# Patient Record
Sex: Male | Born: 1970 | Race: White | Hispanic: No | Marital: Single | Smoking: Current every day smoker
Health system: Southern US, Community
[De-identification: ages and names within clinical notes are randomized; demographics above are authoritative.]

## PROBLEM LIST (undated history)

## (undated) DIAGNOSIS — E119 Type 2 diabetes mellitus without complications: Secondary | ICD-10-CM

## (undated) DIAGNOSIS — E039 Hypothyroidism, unspecified: Secondary | ICD-10-CM

## (undated) DIAGNOSIS — F17200 Nicotine dependence, unspecified, uncomplicated: Secondary | ICD-10-CM

## (undated) DIAGNOSIS — N529 Male erectile dysfunction, unspecified: Secondary | ICD-10-CM

## (undated) DIAGNOSIS — G4700 Insomnia, unspecified: Secondary | ICD-10-CM

## (undated) DIAGNOSIS — N419 Inflammatory disease of prostate, unspecified: Secondary | ICD-10-CM

## (undated) DIAGNOSIS — E785 Hyperlipidemia, unspecified: Secondary | ICD-10-CM

## (undated) DIAGNOSIS — I10 Essential (primary) hypertension: Secondary | ICD-10-CM

## (undated) DIAGNOSIS — F419 Anxiety disorder, unspecified: Secondary | ICD-10-CM

## (undated) HISTORY — PX: WISDOM TOOTH EXTRACTION: SHX21

## (undated) HISTORY — DX: Hypothyroidism, unspecified: E03.9

## (undated) HISTORY — DX: Inflammatory disease of prostate, unspecified: N41.9

## (undated) HISTORY — DX: Type 2 diabetes mellitus without complications: E11.9

## (undated) HISTORY — DX: Male erectile dysfunction, unspecified: N52.9

## (undated) HISTORY — DX: Hyperlipidemia, unspecified: E78.5

## (undated) HISTORY — DX: Insomnia, unspecified: G47.00

## (undated) HISTORY — DX: Nicotine dependence, unspecified, uncomplicated: F17.200

## (undated) HISTORY — DX: Essential (primary) hypertension: I10

## (undated) HISTORY — DX: Anxiety disorder, unspecified: F41.9

---

## 1994-10-04 HISTORY — PX: WISDOM TOOTH EXTRACTION: SHX21

## 2015-11-18 ENCOUNTER — Encounter: Payer: Self-pay | Admitting: Neurology

## 2015-11-18 ENCOUNTER — Ambulatory Visit (INDEPENDENT_AMBULATORY_CARE_PROVIDER_SITE_OTHER): Payer: BLUE CROSS/BLUE SHIELD | Admitting: Neurology

## 2015-11-18 VITALS — BP 120/78 | HR 96 | Resp 20 | Ht 72.0 in | Wt 219.0 lb

## 2015-11-18 DIAGNOSIS — G4730 Sleep apnea, unspecified: Secondary | ICD-10-CM

## 2015-11-18 DIAGNOSIS — F17200 Nicotine dependence, unspecified, uncomplicated: Secondary | ICD-10-CM

## 2015-11-18 DIAGNOSIS — R0683 Snoring: Secondary | ICD-10-CM | POA: Diagnosis not present

## 2015-11-18 NOTE — Progress Notes (Signed)
SLEEP MEDICINE CLINIC   Provider:  Larey Seat, M D  Referring Provider: Shon Baton, MD Primary Care Physician:  Precious Reel, MD  Chief Complaint  Patient presents with  . New Patient (Initial Visit)    snoring, stops breathing, thinks he needs a sleep study, rm 11, alone   Chief complaint according to patient :  HPI:  Thomas Richard is a 45 y.o. male , seen here as a referral from Dr. Virgina Jock for a sleep consultation,  The patient reports that he had been snoring for many years but didn't feel at the time that his sleep quality was interrupted or impaired. He then was witnessed by a girlfriend to snore but also to stop breathing at night, since she works as a Marine scientist she was well aware of the associated risk factors and health risks. Mr. Thomas Richard also has a medical history of the following comorbidities hypertension and diabetes were endorsed by Dr. Hebert Soho. The patient is an active tobacco user but has not been diagnosed with any primary pulmonary diseases. He is smoking tobacco he has kept his weight steady but his diabetes was not always well controlled. On January 26 his HB A1c was 7.6. His BMI is below 30. 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  Sleep habits are as follows:  His regular bedtime is 10.30 Pm after watching TV and goes to sleep usually promptly. His Bedroom is cool, quiet and dark, he prefers to sleep in the fetal position. He is divorced and sleeps with his dog, sometimes his girlfriend .  He sleeps easily through the night. He has 2-3 nocturia breaks, and goes easily back to sleep. He feels usually spontaneously between 7 and 7.2 30 AM and feels good,  No morning headaches , but sometimes a  dry mouth is noted.   He drinks 1-2 cups of coffee in AM (one mug), no SODA , no Iced tea . He smokes about 1 ppd, for the last 12 years. ETOH ; he drinks 2-3 beer, not daily.  Sleep medical history and family sleep history:  Not aware of childhood  sleep disorders, nobody with diagnosed apnea.   Social history: childless, divorced, one Magazine features editor, Music therapist.    Review of Systems: Out of a complete 14 system review, the patient complains of only the following symptoms, and all other reviewed systems are negative. How likely are you to doze in the following situations: 0 = not likely, 1 = slight chance, 2 = moderate chance, 3 = high chance  Sitting and Reading? 0 Watching Television? 1 Sitting inactive in a public place (theater or meeting)? 0 Lying down in the afternoon when circumstances permit? 1 Sitting and talking to someone? 1 Sitting quietly after lunch without alcohol? 0 In a car, while stopped for a few minutes in traffic?0 As a passenger in a car for an hour without a break?0  Total = 3    Fatigue severity score 9  , depression score 2   Social History   Social History  . Marital Status: Single    Spouse Name: N/A  . Number of Children: N/A  . Years of Education: N/A   Occupational History  . Not on file.   Social History Main Topics  . Smoking status: Current Every Day Smoker -- 1.00 packs/day for 10 years    Types: Cigarettes  . Smokeless tobacco: Not on file  . Alcohol Use: 0.0 oz/week    0  Standard drinks or equivalent per week     Comment: 2 beers per dasy  . Drug Use: No  . Sexual Activity: Not on file   Other Topics Concern  . 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    Family History  Problem Relation Age of Onset  . Depression Mother   . Lung disease Father   . Diabetes Father   . Hypertension Father     Past Medical History  Diagnosis Date  . Diabetes mellitus without complication ([facility])   . Hypertension   . Hyperlipidemia   . Smoker   . Hypothyroid   . Prostatitis   . Insomnia   . ED (erectile dysfunction)   . Anxiety     Past Surgical History  Procedure Laterality Date  . Wisdom tooth  extraction      Current Outpatient Prescriptions  Medication Sig Dispense Refill  . albuterol (PROVENTIL HFA;VENTOLIN HFA) 108 (90 Base) MCG/ACT inhaler Inhale into the lungs every 6 (six) hours as needed for wheezing or shortness of breath.    . ALPRAZolam (XANAX) 1 MG tablet Take 1 mg by mouth at bedtime as needed for anxiety.    Marland Kitchen aspirin 81 MG tablet Take 81 mg by mouth daily.    . cyclobenzaprine (FLEXERIL) 10 MG tablet Take 10 mg by mouth 2 (two) times daily as needed for muscle spasms.    Marland Kitchen escitalopram (LEXAPRO) 10 MG tablet Take 10 mg by mouth daily.    . fosinopril (MONOPRIL) 40 MG tablet Take 40 mg by mouth daily. Take 2 tabs daily.    . Insulin Aspart (NOVOLOG Scanlon) Inject into the skin. Insulin pump. Up to 102 units per day.    . levothyroxine (SYNTHROID, LEVOTHROID) 75 MCG tablet Take 75 mcg by mouth daily before breakfast. 1 tab Mon-Sat 2 tabs on Sunday    . Multiple Vitamin (MULTIVITAMIN) tablet Take 1 tablet by mouth daily.    . sildenafil (REVATIO) 20 MG tablet Take 20 mg by mouth 3 (three) times daily.    . simvastatin (ZOCOR) 40 MG tablet Take 40 mg by mouth daily.     No current facility-administered medications for this visit.    Allergies as of 11/18/2015  . (No Known Allergies)    Vitals: BP 120/78 mmHg  Pulse 96  Resp 20  Ht 6' (1.829 m)  Wt 219 lb (99.338 kg)  BMI 29.70 kg/m2 Last Weight:  Wt Readings from Last 1 Encounters:  11/18/15 219 lb (99.338 kg)   [id number] mass index is 29.7 kg/(m^2).     Last Height:   Ht Readings from Last 1 Encounters:  11/18/15 6' (1.829 m)    Physical exam:  General: The patient is awake, alert and appears not in acute distress. The patient is well groomed. Head: Normocephalic, atraumatic. Neck is supple. Mallampati 3 ,  neck circumference: 17. 5. Nasal airflow unrestricted , TMJ is evident . Retrognathia is mild.  Cardiovascular:  Regular rate and rhythm , without  murmurs or carotid bruit, and without distended neck  veins. Respiratory: Lungs are clear to auscultation. Skin: facial  erythema, palmar erythema.  Trunk: Neurologic exam : The patient is awake and alert, oriented to place and time.   Attention span & concentration ability appears normal.  Speech is fluent,  without  dysarthria, dysphonia or aphasia.  Mood and affect are appropriate.  Cranial  nerves: Pupils are equal and briskly reactive to light. Funduscopic exam without  evidence of pallor or edema.  Extraocular movements  in vertical and horizontal planes intact and without nystagmus. Visual fields by finger perimetry are intact. Hearing to finger rub intact.   Facial sensation intact to fine touch.  Facial motor strength is symmetric and tongue and uvula move midline. Shoulder shrug was symmetrical.   Motor exam:  Normal tone, muscle bulk and symmetric strength in all extremities.  Coordination: Rapid alternating movements in the fingers/hands was normal.  Gait and station: Patient walks without assistive device and is able unassisted to climb up to the exam table. Strength within normal limits. Stance is stable and normal. Turns with 3 Steps.   Deep tendon reflexes: in the upper and lower extremities are symmetric and intact. Babinski maneuver response is downgoing.  The patient was advised of the nature of the diagnosed sleep disorder , the treatment options and risks for general a health and wellness arising from not treating the condition.  I spent more than 30 minutes of face to face time with the patient. Greater than 50% of time was spent in counseling and coordination of care. We have discussed the diagnosis and differential and I answered the patient's questions.     Assessment:  After physical and neurologic examination, review of laboratory studies,  Personal review of imaging studies, reports of other /same  Imaging studies ,  Results of polysomnography/ neurophysiology testing and pre-existing records as far as provided in  visit., my assessment is   1)  Snoring and apneas were observed by the patient's girl friend. He has not noted hypersomnia abut is fatigued sometimes. Morning headaches were denied, but a dry mouth is present.  2)  smoking- He needs to stop, needs help to stop . Chantix offered, he stated Dr Virgina Jock has offered this , too.   3) Capnography needed for Smoker.     Plan:  Treatment plan and additional workup :     Larey Seat MD  11/18/2015   CC: Shon Baton, [place], [place] [address]

## 2015-11-18 NOTE — Patient Instructions (Signed)
Smoking Cessation, Tips for Success If you are ready to quit smoking, congratulations! You have chosen to help yourself be healthier. Cigarettes bring nicotine, tar, carbon monoxide, and other irritants into your body. Your lungs, heart, and blood vessels will be able to work better without these poisons. There are many different ways to quit smoking. Nicotine gum, nicotine patches, a nicotine inhaler, or nicotine nasal spray can help with physical craving. Hypnosis, support groups, and medicines help break the habit of smoking. WHAT THINGS CAN I DO TO MAKE QUITTING EASIER?  Here are some tips to help you quit for good:  Pick a date when you will quit smoking completely. Tell all of your friends and family about your plan to quit on that date.  Do not try to slowly cut down on the number of cigarettes you are smoking. Pick a quit date and quit smoking completely starting on that day.  Throw away all cigarettes.   Clean and remove all ashtrays from your home, work, and car.  On a card, write down your reasons for quitting. Carry the card with you and read it when you get the urge to smoke.  Cleanse your body of nicotine. Drink enough water and fluids to keep your urine clear or pale yellow. Do this after quitting to flush the nicotine from your body.  Learn to predict your moods. Do not let a bad situation be your excuse to have a cigarette. Some situations in your life might tempt you into wanting a cigarette.  Never have "just one" cigarette. It leads to wanting another and another. Remind yourself of your decision to quit.  Change habits associated with smoking. If you smoked while driving or when feeling stressed, try other activities to replace smoking. Stand up when drinking your coffee. Brush your teeth after eating. Sit in a different chair when you read the paper. Avoid alcohol while trying to quit, and try to drink fewer caffeinated beverages. Alcohol and caffeine may urge you to  smoke.  Avoid foods and drinks that can trigger a desire to smoke, such as sugary or spicy foods and alcohol.  Ask people who smoke not to smoke around you.  Have something planned to do right after eating or having a cup of coffee. For example, plan to take a walk or exercise.  Try a relaxation exercise to calm you down and decrease your stress. Remember, you may be tense and nervous for the first 2 weeks after you quit, but this will pass.  Find new activities to keep your hands busy. Play with a pen, coin, or rubber band. Doodle or draw things on paper.  Brush your teeth right after eating. This will help cut down on the craving for the taste of tobacco after meals. You can also try mouthwash.   Use oral substitutes in place of cigarettes. Try using lemon drops, carrots, cinnamon sticks, or chewing gum. Keep them handy so they are available when you have the urge to smoke.  When you have the urge to smoke, try deep breathing.  Designate your home as a nonsmoking area.  If you are a heavy smoker, ask your health care provider about a prescription for nicotine chewing gum. It can ease your withdrawal from nicotine.  Reward yourself. Set aside the cigarette money you save and buy yourself something nice.  Look for support from others. Join a support group or smoking cessation program. Ask someone at home or at work to help you with your plan   to quit smoking.  Always ask yourself, "Do I need this cigarette or is this just a reflex?" Tell yourself, "Today, I choose not to smoke," or "I do not want to smoke." You are reminding yourself of your decision to quit.  Do not replace cigarette smoking with electronic cigarettes (commonly called e-cigarettes). The safety of e-cigarettes is unknown, and some may contain harmful chemicals.  If you relapse, do not give up! Plan ahead and think about what you will do the next time you get the urge to smoke. HOW WILL I FEEL WHEN I QUIT SMOKING? You  may have symptoms of withdrawal because your body is used to nicotine (the addictive substance in cigarettes). You may crave cigarettes, be irritable, feel very hungry, cough often, get headaches, or have difficulty concentrating. The withdrawal symptoms are only temporary. They are strongest when you first quit but will go away within 10-14 days. When withdrawal symptoms occur, stay in control. Think about your reasons for quitting. Remind yourself that these are signs that your body is healing and getting used to being without cigarettes. Remember that withdrawal symptoms are easier to treat than the major diseases that smoking can cause.  Even after the withdrawal is over, expect periodic urges to smoke. However, these cravings are generally short lived and will go away whether you smoke or not. Do not smoke! WHAT RESOURCES ARE AVAILABLE TO HELP ME QUIT SMOKING? Your health care provider can direct you to community resources or hospitals for support, which may include:  Group support.  Education.  Hypnosis.  Therapy.   This information is not intended to replace advice given to you by your health care provider. Make sure you discuss any questions you have with your health care provider.   Document Released: 06/18/2004 Document Revised: 10/11/2014 Document Reviewed: 03/08/2013 Elsevier Interactive Patient Education 2016 Elsevier Inc.  

## 2017-06-27 DIAGNOSIS — E109 Type 1 diabetes mellitus without complications: Secondary | ICD-10-CM | POA: Diagnosis not present

## 2017-07-11 DIAGNOSIS — F17210 Nicotine dependence, cigarettes, uncomplicated: Secondary | ICD-10-CM | POA: Diagnosis not present

## 2017-07-11 DIAGNOSIS — Z23 Encounter for immunization: Secondary | ICD-10-CM | POA: Diagnosis not present

## 2017-07-11 DIAGNOSIS — E038 Other specified hypothyroidism: Secondary | ICD-10-CM | POA: Diagnosis not present

## 2017-07-11 DIAGNOSIS — E109 Type 1 diabetes mellitus without complications: Secondary | ICD-10-CM | POA: Diagnosis not present

## 2017-07-11 DIAGNOSIS — I10 Essential (primary) hypertension: Secondary | ICD-10-CM | POA: Diagnosis not present

## 2017-10-12 DIAGNOSIS — H5213 Myopia, bilateral: Secondary | ICD-10-CM | POA: Diagnosis not present

## 2017-10-12 DIAGNOSIS — E109 Type 1 diabetes mellitus without complications: Secondary | ICD-10-CM | POA: Diagnosis not present

## 2017-10-12 DIAGNOSIS — H524 Presbyopia: Secondary | ICD-10-CM | POA: Diagnosis not present

## 2017-10-12 DIAGNOSIS — H52203 Unspecified astigmatism, bilateral: Secondary | ICD-10-CM | POA: Diagnosis not present

## 2017-11-11 DIAGNOSIS — E038 Other specified hypothyroidism: Secondary | ICD-10-CM | POA: Diagnosis not present

## 2017-11-11 DIAGNOSIS — Z0000 Encounter for general adult medical examination without abnormal findings: Secondary | ICD-10-CM | POA: Diagnosis not present

## 2017-11-11 DIAGNOSIS — E109 Type 1 diabetes mellitus without complications: Secondary | ICD-10-CM | POA: Diagnosis not present

## 2017-11-11 DIAGNOSIS — Z125 Encounter for screening for malignant neoplasm of prostate: Secondary | ICD-10-CM | POA: Diagnosis not present

## 2017-11-11 DIAGNOSIS — R82998 Other abnormal findings in urine: Secondary | ICD-10-CM | POA: Diagnosis not present

## 2017-11-18 DIAGNOSIS — Z0000 Encounter for general adult medical examination without abnormal findings: Secondary | ICD-10-CM | POA: Diagnosis not present

## 2017-11-18 DIAGNOSIS — I10 Essential (primary) hypertension: Secondary | ICD-10-CM | POA: Diagnosis not present

## 2017-11-18 DIAGNOSIS — E038 Other specified hypothyroidism: Secondary | ICD-10-CM | POA: Diagnosis not present

## 2017-11-18 DIAGNOSIS — E109 Type 1 diabetes mellitus without complications: Secondary | ICD-10-CM | POA: Diagnosis not present

## 2017-11-18 DIAGNOSIS — Z1389 Encounter for screening for other disorder: Secondary | ICD-10-CM | POA: Diagnosis not present

## 2017-11-18 DIAGNOSIS — E7849 Other hyperlipidemia: Secondary | ICD-10-CM | POA: Diagnosis not present

## 2017-11-24 ENCOUNTER — Other Ambulatory Visit: Payer: Self-pay | Admitting: Internal Medicine

## 2017-11-24 DIAGNOSIS — E785 Hyperlipidemia, unspecified: Secondary | ICD-10-CM

## 2017-12-02 DIAGNOSIS — E109 Type 1 diabetes mellitus without complications: Secondary | ICD-10-CM | POA: Diagnosis not present

## 2017-12-02 DIAGNOSIS — F17210 Nicotine dependence, cigarettes, uncomplicated: Secondary | ICD-10-CM | POA: Diagnosis not present

## 2017-12-02 DIAGNOSIS — I10 Essential (primary) hypertension: Secondary | ICD-10-CM | POA: Diagnosis not present

## 2017-12-02 DIAGNOSIS — Z6828 Body mass index (BMI) 28.0-28.9, adult: Secondary | ICD-10-CM | POA: Diagnosis not present

## 2017-12-04 DIAGNOSIS — E109 Type 1 diabetes mellitus without complications: Secondary | ICD-10-CM | POA: Diagnosis not present

## 2017-12-12 ENCOUNTER — Other Ambulatory Visit: Payer: Self-pay

## 2017-12-13 ENCOUNTER — Ambulatory Visit
Admission: RE | Admit: 2017-12-13 | Discharge: 2017-12-13 | Disposition: A | Discharge status: Home / Self Care | Payer: No Typology Code available for payment source | Source: Ambulatory Visit | Attending: Internal Medicine | Admitting: Internal Medicine

## 2017-12-13 DIAGNOSIS — E785 Hyperlipidemia, unspecified: Secondary | ICD-10-CM

## 2017-12-13 IMAGING — CT CT HEART SCORING
3 series · 12 of 20 positions shown, 14 images · non-contrast
Comparison: None.

CLINICAL DATA: 46-year-old male with history of elevated
cholesterol. Current smoker.

EXAM:
CT HEART FOR CALCIUM SCORING
TECHNIQUE: CT heart was performed on a 64 channel system using prospective ECG
gating.
A non-contrast exam for calcium scoring was performed.
Note that this exam targets the heart and the chest was not imaged
in its entirety.

[Series 2: calcium scoring 2.00 qr36 bestdiast 70% · axial · 0.39mm/px · z∈[+1516,+1546]mm · 2 of 78 slices shown]
[im 16/78  vessel]
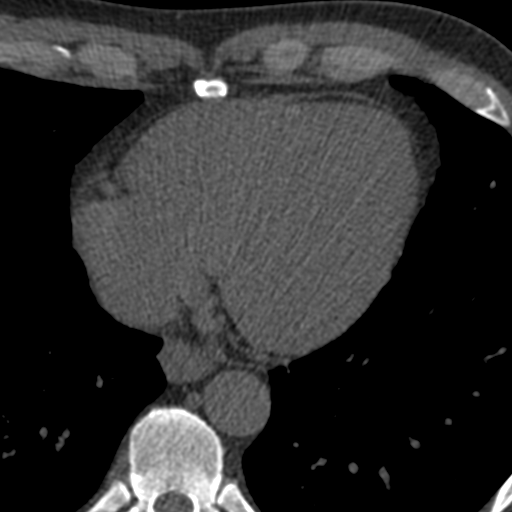
[im 31/78  vessel]
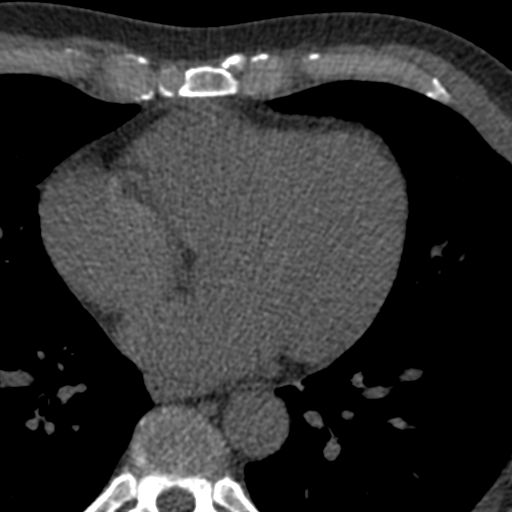

[Series 3: calcium scoring 2.00 br40 bestdiast 70% ax fov · axial · 0.51mm/px · z∈[+1510,+1614]mm · 5 of 78 slices shown, 7 images]
[im 13/78  vessel]
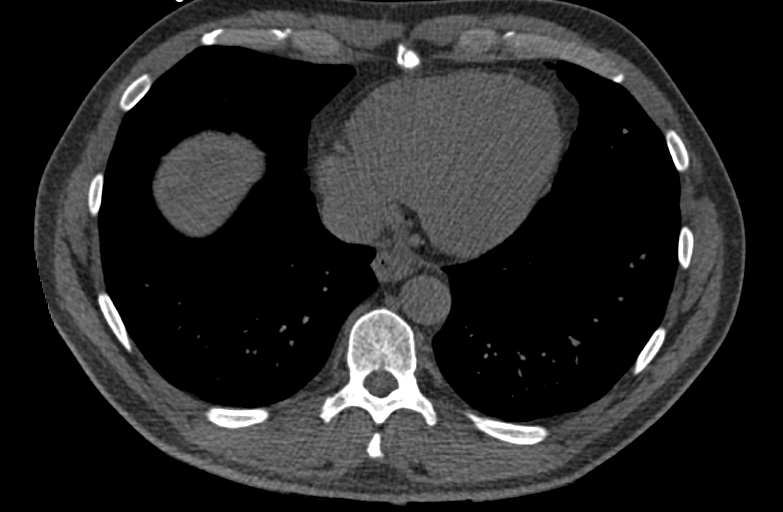
[im 13/78  lung]
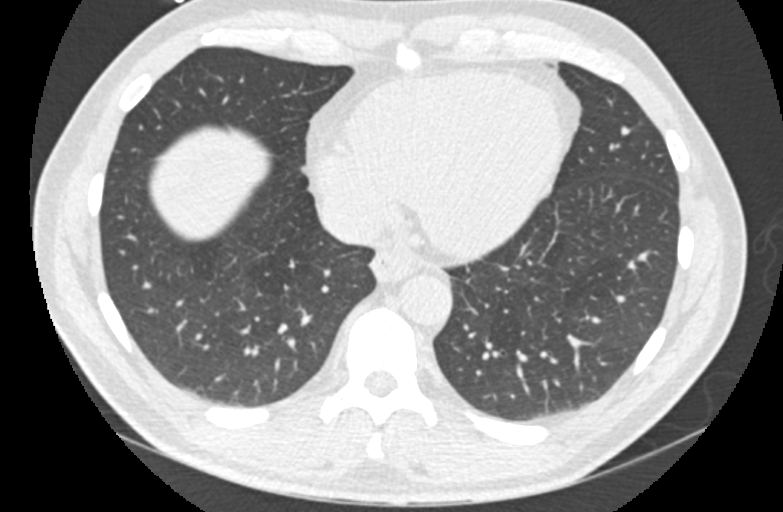
[im 26/78  vessel]
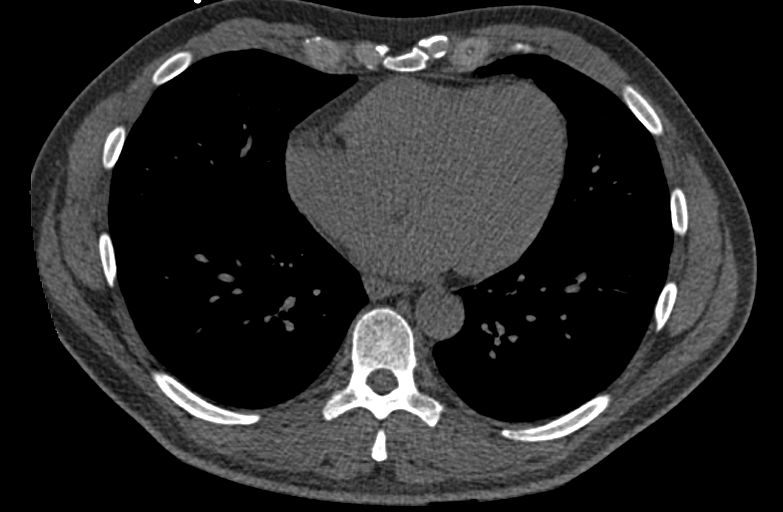
[im 39/78  vessel]
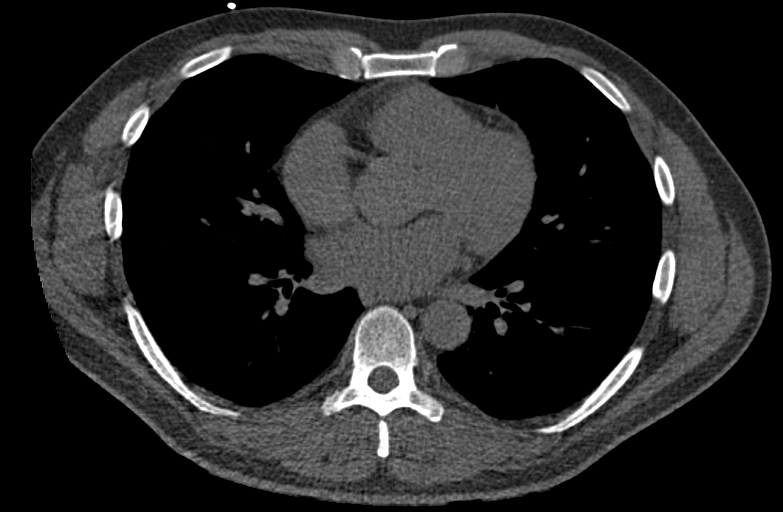
[im 52/78  vessel]
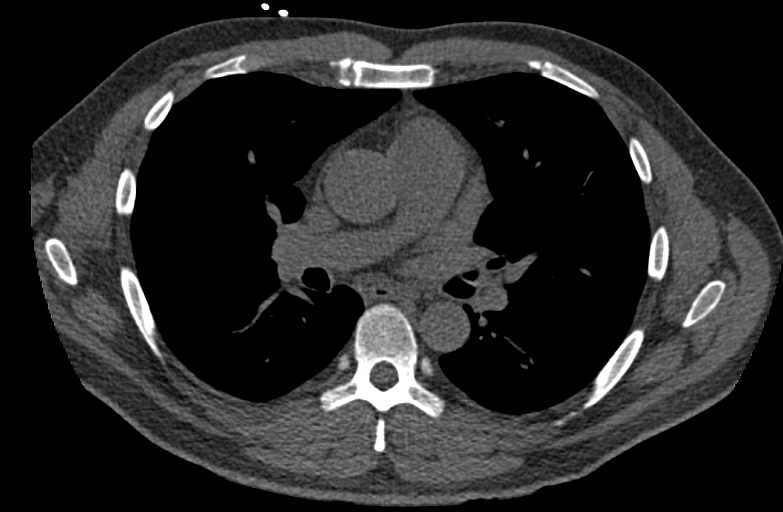
[im 65/78  vessel]
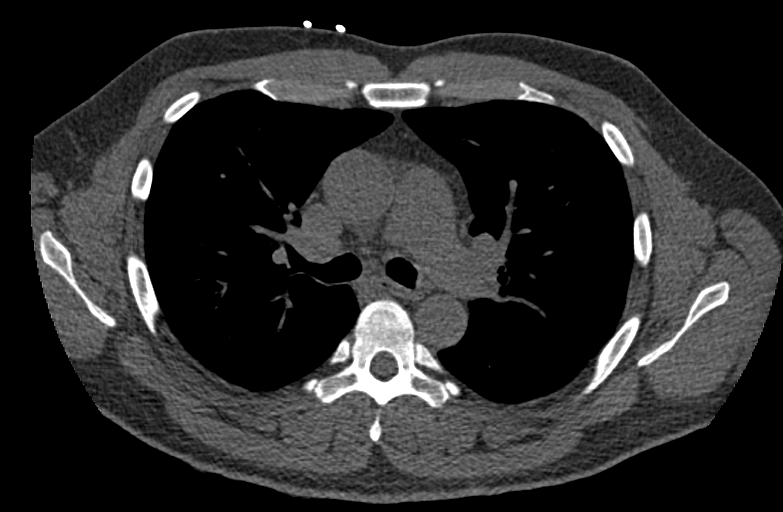
[im 65/78  lung]
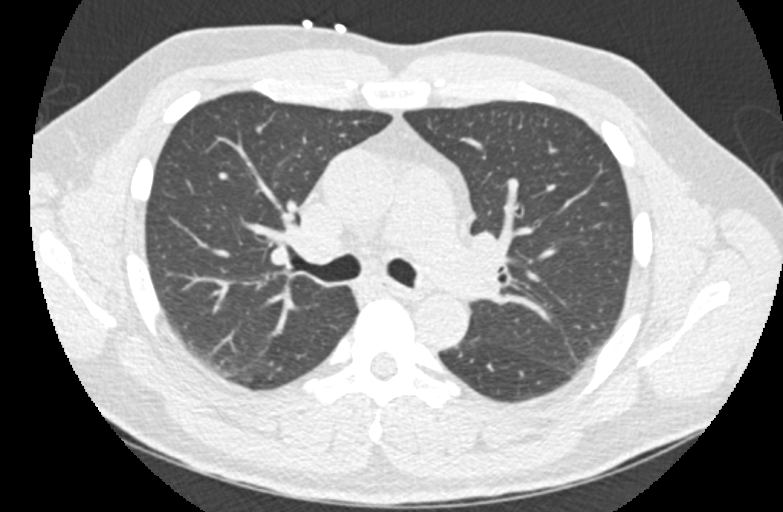

[Series 9: calcium scoring 2.00 br60 bestdiast 70% ax fov · axial · 0.53mm/px · z∈[+1509,+1613]mm · 5 of 80 slices shown]
[im 14/80  vessel]
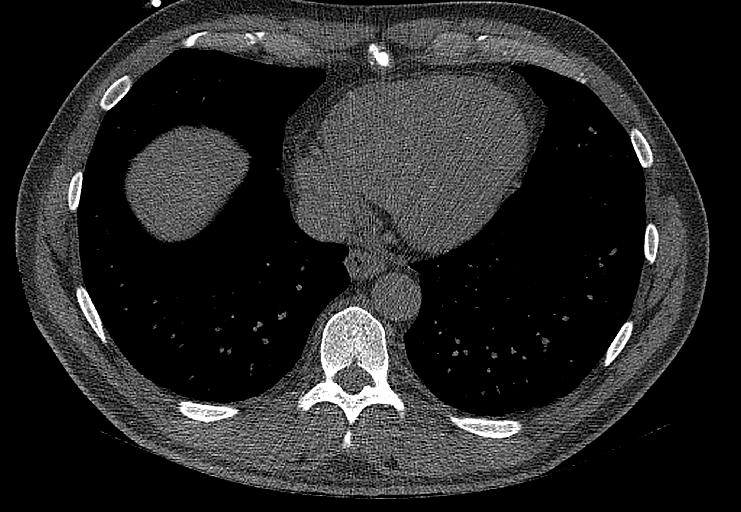
[im 27/80  vessel]
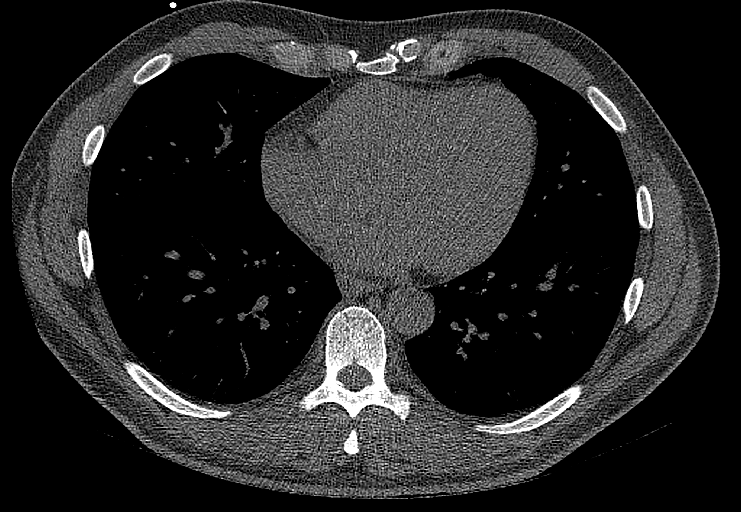
[im 40/80  vessel]
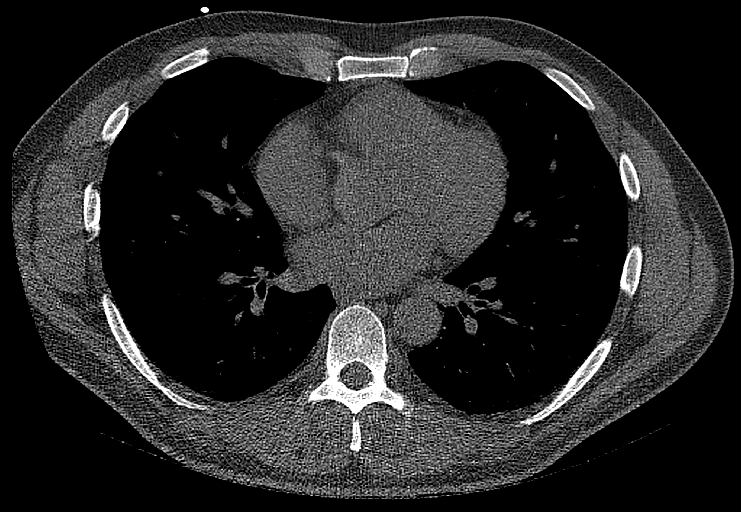
[im 53/80  vessel]
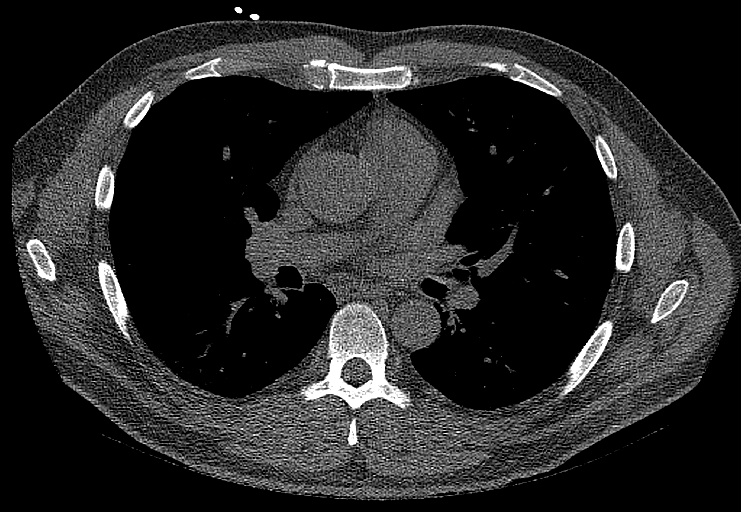
[im 66/80  vessel]
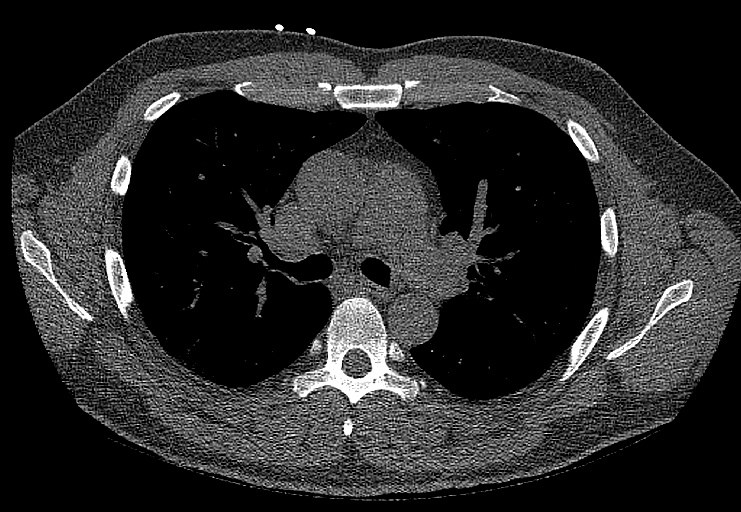

[12 of 20 positions shown; findings below may reference images not displayed]

FINDINGS: Technical quality: Good.

CORONARY CALCIUM

Total Agatston Score: 0

[HOSPITAL] percentile:  N/A

OTHER FINDINGS:

Within the visualized portions of the thorax there are no suspicious
appearing pulmonary nodules or masses, there is no acute
consolidative airspace disease, no pleural effusions, no
pneumothorax and no lymphadenopathy. Visualized portions of the
upper abdomen are unremarkable. There are no aggressive appearing
lytic or blastic lesions noted in the visualized portions of the
skeleton.
IMPRESSION: 1. Patient's total coronary artery calcium score is 0 which
indicates a very low (but non-zero) risk of major adverse
cardiovascular events over the next 10 years.
2. No significant incidental noncardiac findings are noted.

## 2017-12-16 ENCOUNTER — Other Ambulatory Visit: Payer: Self-pay

## 2017-12-16 DIAGNOSIS — I10 Essential (primary) hypertension: Secondary | ICD-10-CM | POA: Diagnosis not present

## 2017-12-16 DIAGNOSIS — Z6828 Body mass index (BMI) 28.0-28.9, adult: Secondary | ICD-10-CM | POA: Diagnosis not present

## 2018-03-27 DIAGNOSIS — R51 Headache: Secondary | ICD-10-CM | POA: Diagnosis not present

## 2018-03-27 DIAGNOSIS — E109 Type 1 diabetes mellitus without complications: Secondary | ICD-10-CM | POA: Diagnosis not present

## 2018-03-27 DIAGNOSIS — I10 Essential (primary) hypertension: Secondary | ICD-10-CM | POA: Diagnosis not present

## 2018-03-27 DIAGNOSIS — E038 Other specified hypothyroidism: Secondary | ICD-10-CM | POA: Diagnosis not present

## 2018-04-27 DIAGNOSIS — E109 Type 1 diabetes mellitus without complications: Secondary | ICD-10-CM | POA: Diagnosis not present

## 2018-04-27 DIAGNOSIS — Z794 Long term (current) use of insulin: Secondary | ICD-10-CM | POA: Diagnosis not present

## 2018-06-12 DIAGNOSIS — K1379 Other lesions of oral mucosa: Secondary | ICD-10-CM | POA: Diagnosis not present

## 2018-06-12 DIAGNOSIS — Z6828 Body mass index (BMI) 28.0-28.9, adult: Secondary | ICD-10-CM | POA: Diagnosis not present

## 2018-06-12 DIAGNOSIS — R05 Cough: Secondary | ICD-10-CM | POA: Diagnosis not present

## 2018-06-12 DIAGNOSIS — J029 Acute pharyngitis, unspecified: Secondary | ICD-10-CM | POA: Diagnosis not present

## 2018-07-27 DIAGNOSIS — Z23 Encounter for immunization: Secondary | ICD-10-CM | POA: Diagnosis not present

## 2018-08-09 DIAGNOSIS — E038 Other specified hypothyroidism: Secondary | ICD-10-CM | POA: Diagnosis not present

## 2018-08-09 DIAGNOSIS — E109 Type 1 diabetes mellitus without complications: Secondary | ICD-10-CM | POA: Diagnosis not present

## 2018-09-15 ENCOUNTER — Encounter (INDEPENDENT_AMBULATORY_CARE_PROVIDER_SITE_OTHER): Payer: Self-pay | Admitting: Orthopaedic Surgery

## 2018-09-15 ENCOUNTER — Ambulatory Visit (INDEPENDENT_AMBULATORY_CARE_PROVIDER_SITE_OTHER): Payer: Self-pay

## 2018-09-15 ENCOUNTER — Ambulatory Visit (INDEPENDENT_AMBULATORY_CARE_PROVIDER_SITE_OTHER): Payer: BLUE CROSS/BLUE SHIELD | Admitting: Orthopaedic Surgery

## 2018-09-15 DIAGNOSIS — M7021 Olecranon bursitis, right elbow: Secondary | ICD-10-CM | POA: Diagnosis not present

## 2018-09-15 MED ORDER — LIDOCAINE HCL 1 % IJ SOLN
1.0000 mL | INTRAMUSCULAR | Status: AC | PRN
  Administered 2018-09-15: 1 mL

## 2018-09-15 MED ORDER — METHYLPREDNISOLONE ACETATE 40 MG/ML IJ SUSP
40.0000 mg | INTRAMUSCULAR | Status: AC | PRN
  Administered 2018-09-15: 40 mg via INTRA_ARTICULAR

## 2018-09-15 MED ORDER — BUPIVACAINE HCL 0.5 % IJ SOLN
1.0000 mL | INTRAMUSCULAR | Status: AC | PRN
  Administered 2018-09-15: 1 mL via INTRA_ARTICULAR

## 2018-09-15 MED ORDER — SULFAMETHOXAZOLE-TRIMETHOPRIM 800-160 MG PO TABS: 1.0000 | ORAL_TABLET | Freq: Two times a day (BID) | ORAL | 0 refills | Status: DC

## 2018-09-15 NOTE — Progress Notes (Signed)
Office Visit Note   Patient: Thomas Richard           Date of Birth: 21-Jun-1971           MRN: [id number] Visit Date: 09/15/2018              Requested by: Shon Baton, [address] [place], [place] [address] PCP: Shon Baton, MD   Assessment & Plan: Visit Diagnoses:  1. Olecranon bursitis of right elbow     Plan: Impression is right olecranon bursitis.  We performed aspiration injection today which yielded 8 cc of blood-tinged fluid.  There is no evidence of infection.  I did inject cortisone also today.  Compression wrap was applied.  I would like to prophylactically put him on a week of Bactrim.  In the meantime continue with heat and compression and NSAIDs as needed. Questions encouraged and answered.  Follow-up as needed.  Follow-Up Instructions: Return if symptoms worsen or fail to improve.   Orders:  Orders Placed This Encounter  Procedures  . XR Elbow Complete Right (3+View)   Meds ordered this encounter  Medications  . sulfamethoxazole-trimethoprim (BACTRIM DS,SEPTRA DS) 800-160 MG tablet    Sig: Take 1 tablet by mouth 2 (two) times daily.    Dispense:  14 tablet    Refill:  0      Procedures: Medium Joint Inj: R olecranon bursa on 09/15/2018 9:30 AM Details: 22 G needle Medications: 1 mL lidocaine 1 %; 40 mg methylPREDNISolone acetate 40 MG/ML; 1 mL bupivacaine 0.5 % Aspirate: 8 mL blood-tinged Outcome: tolerated well, no immediate complications      Clinical Data: No additional findings.   Subjective: Chief Complaint  Patient presents with  . Right Elbow - Pain    Thomas Richard is a 47 year old gentleman who comes in with 2 weeks of right elbow swelling of insidious onset.  He cannot remember any injuries.  He denies any constitutional symptoms.  He has not had this before.  He denies any pain.  He did try sticking a needle into it this past weekend but was unable to obtain any fluid.   Review of Systems  Constitutional: Negative.   All other systems  reviewed and are negative.    Objective: Vital Signs: There were no vitals taken for this visit.  Physical Exam Vitals signs and nursing note reviewed.  Constitutional:      Appearance: He is well-developed.  HENT:     Head: Normocephalic and atraumatic.  Eyes:     Pupils: Pupils are equal, round, and reactive to light.  Neck:     Musculoskeletal: Neck supple.  Pulmonary:     Effort: Pulmonary effort is normal.  Abdominal:     Palpations: Abdomen is soft.  Musculoskeletal: Normal range of motion.  Skin:    General: Skin is warm.  Neurological:     Mental Status: He is alert and oriented to person, place, and time.  Psychiatric:        Behavior: Behavior normal.        Thought Content: Thought content normal.        Judgment: Judgment normal.     Ortho Exam Right elbow exam shows a fluctuant olecranon bursa.  There is some redness directly over the bursa.  There is no ascending cellulitis.  Elbow range of motion is painless and full. Specialty Comments:  No specialty comments available.  Imaging: Xr Elbow Complete Right (3+view)  Result Date: 09/15/2018 No acute or structural abnormalities  PMFS History: There are no active problems to display for this patient.  Past Medical History:  Diagnosis Date  . Anxiety   . Diabetes mellitus without complication ([place])   . ED (erectile dysfunction)   . Hyperlipidemia   . Hypertension   . Hypothyroid   . Insomnia   . Prostatitis   . Smoker     Family History  Problem Relation Age of Onset  . Depression Mother   . Lung disease Father   . Diabetes Father   . Hypertension Father     Past Surgical History:  Procedure Laterality Date  . WISDOM TOOTH EXTRACTION     Social History   Occupational History  . Not on file  Tobacco Use  . Smoking status: Current Every Day Smoker    Packs/day: 1.00    Years: 10.00    Pack years: 10.00    Types: Cigarettes  . Smokeless tobacco: Never Used  Substance and  Sexual Activity  . Alcohol use: Yes    Alcohol/week: 0.0 standard drinks    Comment: 2 beers per dasy  . Drug use: No  . Sexual activity: Not on file

## 2018-10-19 DIAGNOSIS — Z794 Long term (current) use of insulin: Secondary | ICD-10-CM | POA: Diagnosis not present

## 2018-10-19 DIAGNOSIS — E109 Type 1 diabetes mellitus without complications: Secondary | ICD-10-CM | POA: Diagnosis not present

## 2018-11-14 DIAGNOSIS — E038 Other specified hypothyroidism: Secondary | ICD-10-CM | POA: Diagnosis not present

## 2018-11-14 DIAGNOSIS — E109 Type 1 diabetes mellitus without complications: Secondary | ICD-10-CM | POA: Diagnosis not present

## 2018-11-14 DIAGNOSIS — Z0000 Encounter for general adult medical examination without abnormal findings: Secondary | ICD-10-CM | POA: Diagnosis not present

## 2018-11-14 DIAGNOSIS — Z125 Encounter for screening for malignant neoplasm of prostate: Secondary | ICD-10-CM | POA: Diagnosis not present

## 2018-11-22 DIAGNOSIS — R600 Localized edema: Secondary | ICD-10-CM | POA: Diagnosis not present

## 2018-11-22 DIAGNOSIS — I10 Essential (primary) hypertension: Secondary | ICD-10-CM | POA: Diagnosis not present

## 2018-11-22 DIAGNOSIS — E871 Hypo-osmolality and hyponatremia: Secondary | ICD-10-CM | POA: Diagnosis not present

## 2018-11-22 DIAGNOSIS — M7021 Olecranon bursitis, right elbow: Secondary | ICD-10-CM | POA: Diagnosis not present

## 2018-11-22 DIAGNOSIS — E7849 Other hyperlipidemia: Secondary | ICD-10-CM | POA: Diagnosis not present

## 2018-11-22 DIAGNOSIS — Z0000 Encounter for general adult medical examination without abnormal findings: Secondary | ICD-10-CM | POA: Diagnosis not present

## 2018-11-24 DIAGNOSIS — Z1212 Encounter for screening for malignant neoplasm of rectum: Secondary | ICD-10-CM | POA: Diagnosis not present

## 2019-01-03 ENCOUNTER — Telehealth: Payer: Self-pay | Admitting: Neurology

## 2019-01-03 NOTE — Telephone Encounter (Signed)
Called the patient to inform them that our office has placed new protocols in place for our office visits. There was no answer, LVM informing the patient that due to the virus pandemic our office is reducing our number of office visits in order to minimize the risk to our patients and healthcare providers. Advised that our office is now providing the capability to offer the patients video visits at this time. Instructed the patient to call back if this was something of interest for the patient.

## 2019-01-04 ENCOUNTER — Institutional Professional Consult (permissible substitution): Payer: BLUE CROSS/BLUE SHIELD | Admitting: Neurology

## 2019-02-02 DIAGNOSIS — E109 Type 1 diabetes mellitus without complications: Secondary | ICD-10-CM | POA: Diagnosis not present

## 2019-02-17 DIAGNOSIS — M25562 Pain in left knee: Secondary | ICD-10-CM | POA: Diagnosis not present

## 2019-02-20 ENCOUNTER — Ambulatory Visit: Payer: Self-pay

## 2019-02-20 ENCOUNTER — Ambulatory Visit: Payer: BLUE CROSS/BLUE SHIELD | Admitting: Orthopaedic Surgery

## 2019-02-20 ENCOUNTER — Other Ambulatory Visit: Payer: Self-pay

## 2019-02-20 ENCOUNTER — Encounter: Payer: Self-pay | Admitting: Orthopaedic Surgery

## 2019-02-20 DIAGNOSIS — M25562 Pain in left knee: Secondary | ICD-10-CM | POA: Diagnosis not present

## 2019-02-20 DIAGNOSIS — G8929 Other chronic pain: Secondary | ICD-10-CM | POA: Diagnosis not present

## 2019-02-20 MED ORDER — BUPIVACAINE HCL 0.25 % IJ SOLN
2.0000 mL | INTRAMUSCULAR | Status: AC | PRN
  Administered 2019-02-20: 16:00:00 2 mL via INTRA_ARTICULAR

## 2019-02-20 MED ORDER — LIDOCAINE HCL 1 % IJ SOLN
2.0000 mL | INTRAMUSCULAR | Status: AC | PRN
  Administered 2019-02-20: 2 mL

## 2019-02-20 MED ORDER — METHYLPREDNISOLONE ACETATE 40 MG/ML IJ SUSP
40.0000 mg | INTRAMUSCULAR | Status: AC | PRN
  Administered 2019-02-20: 40 mg via INTRA_ARTICULAR

## 2019-02-20 NOTE — Progress Notes (Signed)
Office Visit Note   Patient: Thomas Richard           Date of Birth: [birth year]           MRN: [id number] Visit Date: 02/20/2019              Requested by: Shon Baton, [place], [place] [address] PCP: Shon Baton, MD   Assessment & Plan: Visit Diagnoses:  1. Chronic pain of left knee     Plan: Impression is left knee bucket-handle tear lateral meniscus.  We will obtain a new MRI of the left knee to further assess this.  In the meantime, we will proceed with an intra-articular cortisone injection in hopes of giving him some relief of symptoms.  He will follow-up with [place] once the MRI has been completed.  He will call with concerns or questions in the meantime.  Follow-Up Instructions: Return in about 2 weeks (around 03/06/2019) for MRI results left knee.   Orders:  Orders Placed This Encounter  Procedures   Large Joint Inj   XR Knee Complete 4 Views Left   No orders of the defined types were placed in this encounter.     Procedures: Large Joint Inj: L knee on 02/20/2019 3:31 PM Indications: pain Details: 22 G needle, anterolateral approach Medications: 2 mL bupivacaine 0.25 %; 2 mL lidocaine 1 %; 40 mg methylPREDNISolone acetate 40 MG/ML      Clinical Data: No additional findings.   Subjective: Chief Complaint  Patient presents with   Left Knee - Pain, Edema    HPI patient is a pleasant 48 year old gentleman presents our clinic today with recurrent left knee pain.  History of left knee lateral meniscus tear seen on MRI from September 2015.  Subsequent cortisone injection performed which moderately helped.  He noted infrequent locking catching to the left knee over the past few years, but his pain and mechanical symptoms worsen this past Friday.  No new injury or change in activity.  The pain he has is to the lateral aspect worse with bearing weight and flexing the knee.  He notes increased locking and catching.  He has been taking over-the-counter  medications without relief of symptoms.  He was seen at an urgent care setting on Saturday where he was given an IM prednisone injection.  He has not noticed any relief from the injection.  Review of Systems as detailed in HPI.  All others reviewed and are negative.   Objective: Vital Signs: There were no vitals taken for this visit.  Physical Exam well-developed well-nourished gentleman no acute distress.  Alert and oriented x3.  Ortho Exam examination of the left knee reveals a trace effusion.  Range of motion 0 to 90 degrees.  He does have mild tenderness over the lateral joint line.  No tenderness the medial joint line.  He is stable valgus varus stress.  He is neurovascularly intact distally.  Specialty Comments:  No specialty comments available.  Imaging: Xr Knee Complete 4 Views Left  Result Date: 02/20/2019 X-rays demonstrate mild joint space narrowing medial compartment    PMFS History: Patient Active Problem List   Diagnosis Date Noted   Chronic pain of left knee 02/20/2019   Past Medical History:  Diagnosis Date   Anxiety    Diabetes mellitus without complication [facility])    ED (erectile dysfunction)    Hyperlipidemia    Hypertension    Hypothyroid    Insomnia    Prostatitis    Smoker  Family History  Problem Relation Age of Onset   Depression Mother    Lung disease Father    Diabetes Father    Hypertension Father     Past Surgical History:  Procedure Laterality Date   WISDOM TOOTH EXTRACTION     Social History   Occupational History   Not on file  Tobacco Use   Smoking status: Current Every Day Smoker    Packs/day: 1.00    Years: 10.00    Pack years: 10.00    Types: Cigarettes   Smokeless tobacco: Never Used  Substance and Sexual Activity   Alcohol use: Yes    Alcohol/week: 0.0 standard drinks    Comment: 2 beers per dasy   Drug use: No   Sexual activity: Not on file

## 2019-03-02 DIAGNOSIS — M25562 Pain in left knee: Secondary | ICD-10-CM | POA: Diagnosis not present

## 2019-03-08 ENCOUNTER — Encounter: Payer: Self-pay | Admitting: Orthopaedic Surgery

## 2019-03-08 ENCOUNTER — Other Ambulatory Visit: Payer: Self-pay

## 2019-03-08 ENCOUNTER — Ambulatory Visit: Payer: BLUE CROSS/BLUE SHIELD | Admitting: Orthopaedic Surgery

## 2019-03-08 DIAGNOSIS — M25562 Pain in left knee: Secondary | ICD-10-CM

## 2019-03-08 DIAGNOSIS — G8929 Other chronic pain: Secondary | ICD-10-CM

## 2019-03-08 NOTE — Progress Notes (Signed)
   Office Visit Note   Patient: Thomas Richard           Date of Birth: 16-Jul-1971           MRN: [id number] Visit Date: 03/08/2019              Requested by: Shon Baton, [address] [place], [place] [address] PCP: Shon Baton, MD   Assessment & Plan: Visit Diagnoses:  1. Chronic pain of left knee     Plan: MRI is negative for acute or structural abnormalities.  The menisci are unremarkable.  At this time he is doing very well and has improved significantly.  He is to increase activity as tolerated.  Follow-up as needed.  Follow-Up Instructions: Return if symptoms worsen or fail to improve.   Orders:  No orders of the defined types were placed in this encounter.  No orders of the defined types were placed in this encounter.     Procedures: No procedures performed   Clinical Data: No additional findings.   Subjective: Chief Complaint  Patient presents with  . Left Knee - Pain    Mr. Stankowski comes in for MRI review.  He states that his left knee is almost 100% better now.   Review of Systems   Objective: Vital Signs: There were no vitals taken for this visit.  Physical Exam  Ortho Exam Left knee exam is benign.  Full range of motion.  No joint effusion. Specialty Comments:  No specialty comments available.  Imaging: No results found.   PMFS History: Patient Active Problem List   Diagnosis Date Noted  . Chronic pain of left knee 02/20/2019   Past Medical History:  Diagnosis Date  . Anxiety   . Diabetes mellitus without complication ([place])   . ED (erectile dysfunction)   . Hyperlipidemia   . Hypertension   . Hypothyroid   . Insomnia   . Prostatitis   . Smoker     Family History  Problem Relation Age of Onset  . Depression Mother   . Lung disease Father   . Diabetes Father   . Hypertension Father     Past Surgical History:  Procedure Laterality Date  . WISDOM TOOTH EXTRACTION     Social History   Occupational History  . Not on  file  Tobacco Use  . Smoking status: Current Every Day Smoker    Packs/day: 1.00    Years: 10.00    Pack years: 10.00    Types: Cigarettes  . Smokeless tobacco: Never Used  Substance and Sexual Activity  . Alcohol use: Yes    Alcohol/week: 0.0 standard drinks    Comment: 2 beers per dasy  . Drug use: No  . Sexual activity: Not on file

## 2019-04-26 DIAGNOSIS — E109 Type 1 diabetes mellitus without complications: Secondary | ICD-10-CM | POA: Diagnosis not present

## 2019-05-31 DIAGNOSIS — Z23 Encounter for immunization: Secondary | ICD-10-CM | POA: Diagnosis not present

## 2019-07-20 DIAGNOSIS — E039 Hypothyroidism, unspecified: Secondary | ICD-10-CM | POA: Diagnosis not present

## 2019-07-20 DIAGNOSIS — E871 Hypo-osmolality and hyponatremia: Secondary | ICD-10-CM | POA: Diagnosis not present

## 2019-07-20 DIAGNOSIS — R600 Localized edema: Secondary | ICD-10-CM | POA: Diagnosis not present

## 2019-07-20 DIAGNOSIS — I10 Essential (primary) hypertension: Secondary | ICD-10-CM | POA: Diagnosis not present

## 2019-07-20 DIAGNOSIS — M7021 Olecranon bursitis, right elbow: Secondary | ICD-10-CM | POA: Diagnosis not present

## 2019-07-20 DIAGNOSIS — E109 Type 1 diabetes mellitus without complications: Secondary | ICD-10-CM | POA: Diagnosis not present

## 2019-07-20 DIAGNOSIS — M25562 Pain in left knee: Secondary | ICD-10-CM | POA: Diagnosis not present

## 2019-08-28 DIAGNOSIS — E109 Type 1 diabetes mellitus without complications: Secondary | ICD-10-CM | POA: Diagnosis not present

## 2019-08-28 DIAGNOSIS — Z794 Long term (current) use of insulin: Secondary | ICD-10-CM | POA: Diagnosis not present

## 2019-11-22 ENCOUNTER — Ambulatory Visit: Payer: BC Managed Care – PPO

## 2019-11-26 ENCOUNTER — Ambulatory Visit: Payer: BC Managed Care – PPO | Attending: Family

## 2019-11-26 DIAGNOSIS — Z23 Encounter for immunization: Secondary | ICD-10-CM | POA: Insufficient documentation

## 2019-11-26 NOTE — Progress Notes (Signed)
   Covid-19 Vaccination Clinic  Name:  Isamu Gundy    MRN: [id number] DOB: [birth year]  11/26/2019  Mr. Osmun was observed post Covid-19 immunization for 15 minutes without incidence. He was provided with Vaccine Information Sheet and instruction to access the V-Safe system.   Mr. Defronzo was instructed to call 911 with any severe reactions post vaccine: Marland Kitchen Difficulty breathing  . Swelling of your face and throat  . A fast heartbeat  . A bad rash all over your body  . Dizziness and weakness    Immunizations Administered    Name Date Dose VIS Date Route   Moderna COVID-19 Vaccine 11/26/2019 12:07 PM 0.5 mL 09/04/2019 Intramuscular   Manufacturer: Moderna   Lot: [id number]   [id number]

## 2019-12-25 ENCOUNTER — Ambulatory Visit: Payer: BC Managed Care – PPO | Attending: Family

## 2019-12-25 DIAGNOSIS — Z23 Encounter for immunization: Secondary | ICD-10-CM

## 2019-12-25 NOTE — Progress Notes (Signed)
   Covid-19 Vaccination Clinic  Name:  Thomas Richard    MRN: [id number] DOB: September 30, 1971  12/25/2019  Thomas Richard was observed post Covid-19 immunization for 15 minutes without incident. He was provided with Vaccine Information Sheet and instruction to access the V-Safe system.   Thomas Richard was instructed to call 911 with any severe reactions post vaccine: Marland Kitchen Difficulty breathing  . Swelling of face and throat  . A fast heartbeat  . A bad rash all over body  . Dizziness and weakness   Immunizations Administered    Name Date Dose VIS Date Route   Moderna COVID-19 Vaccine 12/25/2019  3:36 PM 0.5 mL 09/04/2019 Intramuscular   Manufacturer: Levan Hurst   Lot: [id number]   [place]:[id number]

## 2020-01-11 DIAGNOSIS — Z125 Encounter for screening for malignant neoplasm of prostate: Secondary | ICD-10-CM | POA: Diagnosis not present

## 2020-01-11 DIAGNOSIS — E7849 Other hyperlipidemia: Secondary | ICD-10-CM | POA: Diagnosis not present

## 2020-01-11 DIAGNOSIS — Z0000 Encounter for general adult medical examination without abnormal findings: Secondary | ICD-10-CM | POA: Diagnosis not present

## 2020-01-11 DIAGNOSIS — E109 Type 1 diabetes mellitus without complications: Secondary | ICD-10-CM | POA: Diagnosis not present

## 2020-01-11 DIAGNOSIS — E038 Other specified hypothyroidism: Secondary | ICD-10-CM | POA: Diagnosis not present

## 2020-01-18 DIAGNOSIS — G44229 Chronic tension-type headache, not intractable: Secondary | ICD-10-CM | POA: Diagnosis not present

## 2020-01-18 DIAGNOSIS — R82998 Other abnormal findings in urine: Secondary | ICD-10-CM | POA: Diagnosis not present

## 2020-01-18 DIAGNOSIS — R600 Localized edema: Secondary | ICD-10-CM | POA: Diagnosis not present

## 2020-01-18 DIAGNOSIS — M25562 Pain in left knee: Secondary | ICD-10-CM | POA: Diagnosis not present

## 2020-01-18 DIAGNOSIS — L309 Dermatitis, unspecified: Secondary | ICD-10-CM | POA: Diagnosis not present

## 2020-01-18 DIAGNOSIS — Z0000 Encounter for general adult medical examination without abnormal findings: Secondary | ICD-10-CM | POA: Diagnosis not present

## 2020-02-04 DIAGNOSIS — B352 Tinea manuum: Secondary | ICD-10-CM | POA: Diagnosis not present

## 2020-02-06 ENCOUNTER — Other Ambulatory Visit: Payer: Self-pay | Admitting: Neurology

## 2020-02-07 ENCOUNTER — Ambulatory Visit (INDEPENDENT_AMBULATORY_CARE_PROVIDER_SITE_OTHER): Payer: BC Managed Care – PPO | Admitting: Neurology

## 2020-02-07 ENCOUNTER — Other Ambulatory Visit: Payer: Self-pay

## 2020-02-07 ENCOUNTER — Encounter: Payer: Self-pay | Admitting: Neurology

## 2020-02-07 VITALS — BP 118/86 | HR 95 | Temp 97.2°F | Ht 72.0 in | Wt 221.0 lb

## 2020-02-07 DIAGNOSIS — R519 Headache, unspecified: Secondary | ICD-10-CM | POA: Diagnosis not present

## 2020-02-07 DIAGNOSIS — G8929 Other chronic pain: Secondary | ICD-10-CM

## 2020-02-07 DIAGNOSIS — T3995XA Adverse effect of unspecified nonopioid analgesic, antipyretic and antirheumatic, initial encounter: Secondary | ICD-10-CM | POA: Insufficient documentation

## 2020-02-07 DIAGNOSIS — F17210 Nicotine dependence, cigarettes, uncomplicated: Secondary | ICD-10-CM | POA: Insufficient documentation

## 2020-02-07 DIAGNOSIS — Z9641 Presence of insulin pump (external) (internal): Secondary | ICD-10-CM

## 2020-02-07 DIAGNOSIS — G4440 Drug-induced headache, not elsewhere classified, not intractable: Secondary | ICD-10-CM | POA: Insufficient documentation

## 2020-02-07 MED ORDER — ZONISAMIDE 25 MG PO CAPS: 25.0000 mg | ORAL_CAPSULE | Freq: Two times a day (BID) | ORAL | 2 refills | Status: DC

## 2020-02-07 NOTE — Patient Instructions (Signed)
Zonisamide capsules What is this medicine? ZONISAMIDE (zoe NIS a mide) is used to control partial seizures in adults with epilepsy. This medicine may be used for other purposes; ask your health care provider or pharmacist if you have questions. COMMON BRAND NAME(S): Zonegran What should I tell my health care provider before I take this medicine? They need to know if you have any of these conditions:  kidney disease  liver disease  low level of bicarbonate in the blood  lung or breathing disease, like asthma  suicidal thoughts, plans, or attempt; a previous suicide attempt by you or a family member  an unusual or allergic reaction to zonisamide, sulfa drugs, other medicines, foods, dyes, or preservatives  pregnant or trying to get pregnant  breast-feeding How should I use this medicine? Take this medicine by mouth with a glass of water. Follow the directions on the prescription label. Do not cut, crush or chew this medicine. Swallow the capsules whole. You can take this medicine with or without food. If it upsets your stomach, take it with food. Take your medicine at regular intervals. Do not take it more often than directed. Do not stop taking except on your doctor's advice. A special MedGuide will be given to you by the pharmacist with each prescription and refill. Be sure to read this information carefully each time. Talk to your pediatrician regarding the use of this medicine in children. While this drug may be prescribed for children as young as 16 years for selected conditions, precautions do apply. Overdosage: If you think you have taken too much of this medicine contact a poison control center or emergency room at once. NOTE: This medicine is only for you. Do not share this medicine with others. What if I miss a dose? If you miss a dose, take it as soon as you can. If it is almost time for your next dose, take only that dose. Do not take double or extra doses. What may interact  with this medicine? This medicine may interact with the following medications:  acetazolamide  antihistamines for allergy, cough, and cold  atropine  certain medicines for anxiety or sleep  certain medicines for bladder problems like oxybutynin, tolterodine  certain medicines for depression like amitriptyline, fluoxetine, sertraline  certain medicines for seizures like carbamazepine, phenobarbital, phenytoin, primidone, topiramate, valproic acid  certain medicines for stomach problems like dicyclomine, hyoscyamine  certain medicines for travel sickness like scopolamine  certain medicines for Parkinson's disease like benztropine, trihexyphenidyl  dichlorphenamide  general anesthetics like halothane, isoflurane, methoxyflurane, propofol  ipratropium  medicines that relax muscles for surgery  narcotic medicines for pain  phenothiazines like chlorpromazine, mesoridazine, prochlorperazine, thioridazine  rifampin This list may not describe all possible interactions. Give your health care provider a list of all the medicines, herbs, non-prescription drugs, or dietary supplements you use. Also tell them if you smoke, drink alcohol, or use illegal drugs. Some items may interact with your medicine. What should I watch for while using this medicine? Visit your health care professional for regular checks on your progress. Tell your health care professional if your symptoms do not start to get better or if they get worse. Wear a medical ID bracelet or chain. Carry a card that describes your disease and details of your medicine and dosage times. Do not stop taking except on your health care professional's advice. You may develop a severe reaction. Your health care professional will tell you how much medicine to take. You may get drowsy or  dizzy. Do not drive, use machinery, or do anything that needs mental alertness until you know how this medicine affects you. Do not stand up or sit up  quickly, especially if you are an older patient. This reduces the risk of dizzy or fainting spells. Alcohol may interfere with the effect of this medicine. Avoid alcoholic drinks. Tell your health care professional right away if you have any change in your eyesight. This medicine can reduce the response of your body to heat or cold. Dress warm in cold weather and stay hydrated in hot weather. If possible, avoid extreme temperatures like saunas, hot tubs, very hot or cold showers, or activities that can cause dehydration such as vigorous exercise. This medicine may cause serious skin reactions. They can happen weeks to months after starting the medicine. Contact your health care provider right away if you notice fevers or flu-like symptoms with a rash. The rash may be red or purple and then turn into blisters or peeling of the skin. Or, you might notice a red rash with swelling of the face, lips or lymph nodes in your neck or under your arms. If you or your family notice any changes in your behavior, such as new or worsening depression, thoughts of harming yourself, anxiety, other unusual or disturbing thoughts, or memory loss, call your health care professional right away. What side effects may I notice from receiving this medicine? Side effects that you should report to your doctor or health care professional as soon as possible:  allergic reactions like skin rash, itching or hives, swelling of the face, lips, or tongue  blood in the urine  changes in vision  confusion  fever  hallucinations  loss of appetite  loss of memory  pain in the lower back or side  pain when urinating  rash, fever, and swollen lymph nodes  redness, blistering, peeling or loosening of the skin, including inside the mouth  signs and symptoms of increased acid in the body like breathing fast; fast heartbeat; headache; confusion; unusually weak or tired; nausea, vomiting  signs and symptoms of low red blood  cells or anemia such as unusually weak or tired; feeling faint or lightheaded; falls; breathing problems  suicidal thoughts, mood changes  unusual sweating Side effects that usually do not require medical attention (report to your doctor or health care professional if they continue or are bothersome):  dizziness  headache  irritable  nausea  tiredness  trouble sleeping This list may not describe all possible side effects. Call your doctor for medical advice about side effects. You may report side effects to FDA at 1-800-FDA-1088. Where should I keep my medicine? Keep out of reach of children. Store at room temperature between 15 and 30 degrees C (59 and 86 degrees F). Keep in a dry place protected from light. Throw away any unused medicine after the expiration date. NOTE: This sheet is a summary. It may not cover all possible information. If you have questions about this medicine, talk to your doctor, pharmacist, or health care provider.  2020 Elsevier/Gold Standard (2019-01-25 15:13:05)

## 2020-02-07 NOTE — Progress Notes (Signed)
SLEEP MEDICINE CLINIC   Provider:  Larey Seat, M D  Referring Provider: Shon Baton, MD Primary Care Physician:  Thomas Baton, MD  Chief Complaint  Patient presents with  . New Patient (Initial Visit)    pt alone, rm 10. pt states that in the last year he has developed worsening headache in which he is treating over the counter with aleve.    Chief complaint according to patient :  HPI:  Thomas Richard is a 49 y.o. male , has been seen here 11/2015 upon referral from Dr. Virgina Richard for a sleep consultation, now seen for a headache evaluation.   The patient reports that he had experienced a rising frequency of headaches, usually arising on the left side of the head and for a year he has taken 8-10 ADVIL DAILY(!). He describes a throbbing left temple trigger region. When physically active he is not more bothered by his headaches, may even not feel them at all. He is congested, seasonal, and wakes up with stuffy nose.   Reports he is a nose breather .  He has not seen his eye doctor last year, and is reluctant to take a steroid taper due to diabetes.  He had been snoring for many years but didn't feel at the time that his sleep quality was interrupted or impaired. He then was witnessed by a girlfriend to snore but also to stop breathing at night, since she works as a Marine scientist she was well aware of the associated risk factors and health risks.  Thomas Richard dentist has now asked him if he has bruxism, and was concerned about OSA as well.   The current comorbidities of hypertension and diabetes were endorsed in 2017. The patient remains an active tobacco user but has not been diagnosed with any primary pulmonary diseases. He is smoking tobacco he has kept his weight steady but his diabetes was not always well controlled. 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  Sleep habits are as follows: His regular bedtime is 10.30 Pm after watching TV  and goes to sleep usually promptly. His Bedroom is cool, quiet and dark, he prefers to sleep in the fetal position. He is divorced and sleeps with his dog .  He sleeps easily through the night. He has 2-3 nocturia breaks, and goes easily back to sleep. He feels usually spontaneously between 7 and 7.2 30 AM and feels good,  No morning headaches , but sometimes a  dry mouth is noted.   He drinks 1-2 cups of coffee in AM (one mug), no SODA , no Iced tea .  Water during the rest of the day. He smokes about 1 ppd, for the last 12 years. ETOH ; he drinks 2-3 beer, not daily.  Sleep medical history and family sleep history:  Not aware of childhood sleep disorders, nobody with diagnosed apnea.   Social history: childless, divorced, one Magazine features editor, Music therapist.    Review of Systems: Out of a complete 14 system review, the patient complains of only the following symptoms, and all other reviewed systems are negative. How likely are you to doze in the following situations: 0 = not likely, 1 = slight chance, 2 = moderate chance, 3 = high chance.    Sitting and Reading? 0 Watching Television? 1 Sitting inactive in a public place (theater or meeting)? 0 Lying down in the afternoon when circumstances permit? 1 Sitting and talking to someone?  1 Sitting quietly after lunch without alcohol? 0 In a car, while stopped for a few minutes in traffic?0 As a passenger in a car for an hour without a break?0  Total = 3    Fatigue severity score 9  , depression score 2   Social History   Socioeconomic History  . Marital status: Single    Spouse name: Not on file  . Number of children: Not on file  . Years of education: Not on file  . Highest education level: Not on file  Occupational History  . Not on file  Tobacco Use  . Smoking status: Current Every Day Smoker    Packs/day: 1.00    Years: 10.00    Pack years: 10.00    Types: Cigarettes  . Smokeless tobacco: Never Used  Substance and Sexual  Activity  . Alcohol use: Yes    Alcohol/week: 0.0 standard drinks    Comment: 2 beers per dasy  . Drug use: No  . Sexual activity: Not on file  Other Topics Concern  . 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   Social Determinants of Health   Financial Resource Strain:   . Difficulty of Paying Living Expenses:   Food Insecurity:   . Worried About Charity fundraiser in the Last Year:   . Arboriculturist in the Last Year:   Transportation Needs:   . Film/video editor (Medical):   Marland Kitchen Lack of Transportation (Non-Medical):   Physical Activity:   . Days of Exercise per Week:   . Minutes of Exercise per Session:   Stress:   . Feeling of Stress :   Social Connections:   . Frequency of Communication with Friends and Family:   . Frequency of Social Gatherings with Friends and Family:   . Attends Religious Services:   . Active Member of Clubs or Organizations:   . Attends Archivist Meetings:   Marland Kitchen Marital Status:   Intimate Partner Violence:   . Fear of Current or Ex-Partner:   . Emotionally Abused:   Marland Kitchen Physically Abused:   . Sexually Abused:     Family History  Problem Relation Age of Onset  . Depression Mother   . Lung disease Father   . Diabetes Father   . Hypertension Father     Past Medical History:  Diagnosis Date  . Anxiety   . Diabetes mellitus without complication ([place])   . ED (erectile dysfunction)   . Hyperlipidemia   . Hypertension   . Hypothyroid   . Insomnia   . Prostatitis   . Smoker     Past Surgical History:  Procedure Laterality Date  . WISDOM TOOTH EXTRACTION      Current Outpatient Medications  Medication Sig Dispense Refill  . ALPRAZolam (XANAX) 1 MG tablet Take 1 mg by mouth at bedtime as needed for anxiety.    Marland Kitchen aspirin 81 MG tablet Take 81 mg by mouth daily.    Marland Kitchen atorvastatin (LIPITOR) 40 MG tablet Take 40 mg by mouth daily.    Marland Kitchen escitalopram  (LEXAPRO) 10 MG tablet Take 10 mg by mouth daily.    . Insulin Aspart (NOVOLOG Coyle) Inject into the skin. Insulin pump. Up to 102 units per day.    . levothyroxine (SYNTHROID, LEVOTHROID) 75 MCG tablet Take 75 mcg by mouth daily before breakfast. 1 tab Mon-Sat  2 tabs on Sunday    . Multiple Vitamin (MULTIVITAMIN) tablet Take 1 tablet by mouth daily.    Marland Kitchen olmesartan (BENICAR) 40 MG tablet Take 40 mg by mouth daily.    . sildenafil (REVATIO) 20 MG tablet Take 20 mg by mouth 3 (three) times daily.    Marland Kitchen spironolactone (ALDACTONE) 25 MG tablet Take 25 mg by mouth daily.    Marland Kitchen terbinafine (LAMISIL) 250 MG tablet Take 250 mg by mouth daily.     No current facility-administered medications for this visit.    Allergies as of 02/07/2020  . (No Known Allergies)    Vitals: BP 118/86   Pulse 95   Temp (!) 97.2 F (36.2 C)   Ht 6' (1.829 m)   Wt 221 lb (100.2 kg)   BMI 29.97 kg/m  Last Weight:  Wt Readings from Last 1 Encounters:  02/07/20 221 lb (100.2 kg)   [id number] mass index is 29.97 kg/m.     Last Height:   Ht Readings from Last 1 Encounters:  02/07/20 6' (1.829 m)    Physical exam:  General: The patient is awake, alert and appears not in acute distress. The patient is well groomed. Head: Normocephalic, atraumatic. Neck is supple. Mallampati 3 ,  neck circumference: 17. 5. Nasal airflow unrestricted , TMJ is evident . Retrognathia is mild.  Cardiovascular:  Regular rate and rhythm , without  murmurs or carotid bruit, and without distended neck veins. Respiratory: Lungs are clear to auscultation. Skin: facial erythema, palmar erythema.  This has increased since I have last seen the patient.  Trunk: Neurologic exam : The patient is awake and alert, oriented to place and time.   Attention span & concentration ability appears normal.  Speech is fluent,  without  dysarthria, dysphonia or aphasia.  Mood and affect are appropriate.  Cranial nerves: Pupils are equal and briskly  reactive to light. Funduscopic exam deferred. Ciliary injection - reddened facial skin.  Extraocular movements  in vertical and horizontal planes intact and without nystagmus.  Visual fields by finger perimetry are intact. Hearing to finger rub intact.   Facial sensation intact to fine touch.  Facial motor strength is symmetric and tongue and uvula move midline. Shoulder shrug was symmetrical.   Motor exam:  Normal tone, muscle bulk and symmetric strength in all extremities.  Coordination: Rapid alternating movements in the fingers/hands was normal. Mild tremor at rest and with action, dysmetria on finger - to nose test.   Gait and station: Patient walks without assistive device and is able unassisted to climb up to the exam table. Strength within normal limits. Stance is stable and normal. Turns with 3 Steps.   Deep tendon reflexes: in the upper and lower extremities are symmetric and intact. Babinski maneuver response is downgoing.   The patient was advised of the nature of the diagnosed sleep disorder , the treatment options and risks for general a health and wellness arising from not treating the condition.  I spent more than 30 minutes of face to face time with the patient. Greater than 50% of time was spent in counseling and coordination of care. We have discussed the diagnosis and differential and I answered the patient's questions.     Assessment:  After physical and neurologic examination, review of laboratory studies,  Personal review of imaging studies, reports of other /same  Imaging studies ,  Results of polysomnography/ neurophysiology testing and pre-existing records as far as provided in visit., my assessment is  1) :  medication overuse headaches/analgesic rebound headaches.  I will consider Zonisamide for assistance and weaning off. Marland Kitchen   2)  Snoring and apneas were observed before  2017by the patient's former girl friend. Nobody is now witnessing his sleep.  He has not noted  hypersomnia abut is fatigued sometimes. Morning headaches are now present- but headaches do not wake him up out of sleep. A dry mouth is present.  3)  smoking- ( 1 ppd) , he tried chantix- failed to stop- He needs to stop, needs help to stop . Chantix offered, he stated Dr Thomas Richard has offered this , too.    Plan:  Treatment plan and additional workup :  D/c of OTC/ Advil 8 times a day.  Zonisamide 50 mg nightly  Or Topiramate 25 mg bid ( has no history of renal stones). I think that Zonisamide.   No additional betablocker effect desired. His HTN is  well controlled.   HST for hypoxemia in a smoker- may contribute.  He is fully vaccinated.    Larey Seat, MD        Asencion Partridge Shakaya Bhullar MD  02/07/2020   CC: Thomas Richard, [place],  [place] [id number]

## 2020-02-13 DIAGNOSIS — Z794 Long term (current) use of insulin: Secondary | ICD-10-CM | POA: Diagnosis not present

## 2020-02-13 DIAGNOSIS — E109 Type 1 diabetes mellitus without complications: Secondary | ICD-10-CM | POA: Diagnosis not present

## 2020-02-19 ENCOUNTER — Telehealth: Payer: Self-pay

## 2020-02-19 NOTE — Telephone Encounter (Signed)
LVM for pt to call me back to schedule sleep study  

## 2020-02-27 ENCOUNTER — Telehealth: Payer: Self-pay

## 2020-02-27 NOTE — Telephone Encounter (Signed)
LVM for pt to call me back to schedule sleep study  

## 2020-03-05 ENCOUNTER — Telehealth: Payer: Self-pay

## 2020-03-05 NOTE — Telephone Encounter (Signed)
We have attempted to call the patient two times to schedule sleep study.  Patient has been unavailable at the phone numbers we have on file and has not returned our calls. If patient calls back we will schedule them for their sleep study.  

## 2020-04-29 ENCOUNTER — Other Ambulatory Visit: Payer: Self-pay | Admitting: Neurology

## 2020-08-13 ENCOUNTER — Encounter: Payer: Self-pay | Admitting: Orthopaedic Surgery

## 2020-08-13 ENCOUNTER — Ambulatory Visit: Payer: BC Managed Care – PPO | Admitting: Orthopaedic Surgery

## 2020-08-13 DIAGNOSIS — M7711 Lateral epicondylitis, right elbow: Secondary | ICD-10-CM | POA: Insufficient documentation

## 2020-08-13 MED ORDER — BUPIVACAINE HCL 0.5 % IJ SOLN
1.0000 mL | INTRAMUSCULAR | Status: AC | PRN
  Administered 2020-08-13: 1 mL via INTRA_ARTICULAR

## 2020-08-13 MED ORDER — METHYLPREDNISOLONE ACETATE 40 MG/ML IJ SUSP
40.0000 mg | INTRAMUSCULAR | Status: AC | PRN
  Administered 2020-08-13: 40 mg via INTRA_ARTICULAR

## 2020-08-13 MED ORDER — LIDOCAINE HCL 1 % IJ SOLN
1.0000 mL | INTRAMUSCULAR | Status: AC | PRN
  Administered 2020-08-13: 1 mL

## 2020-08-13 NOTE — Progress Notes (Signed)
Office Visit Note   Patient: Thomas Richard           Date of Birth: 1971/06/12           MRN: [id number] Visit Date: 08/13/2020              Requested by: Shon Baton, [place],  [place] [address] PCP: Shon Baton, MD   Assessment & Plan: Visit Diagnoses:  1. Lateral epicondylitis, right elbow     Plan: Impression is right lateral epicondylitis.  Treatment options were discussed today and based on our discussion he would like to try cortisone injection as his pain is constant and with just basic activities of daily living.  He has already tried a counterforce brace which provided minimal relief.  He would like to hold off on physical therapy and dry needling for now.  Follow-up as needed.  Follow-Up Instructions: Return if symptoms worsen or fail to improve.   Orders:  Orders Placed This Encounter  Procedures  . Medium Joint Inj   No orders of the defined types were placed in this encounter.     Procedures: Medium Joint Inj: R lateral epicondyle on 08/13/2020 10:41 AM Indications: pain Details: 25 G needle Medications: 1 mL lidocaine 1 %; 1 mL bupivacaine 0.5 %; 40 mg methylPREDNISolone acetate 40 MG/ML Outcome: tolerated well, no immediate complications Patient was prepped and draped in the usual sterile fashion.       Clinical Data: No additional findings.   Subjective: Chief Complaint  Patient presents with  . Right Elbow - Pain    Patient is a 49 year old gentleman comes in for evaluation of chronic right elbow pain for about a month.  The pain is constant and with pretty much every activity that he does.  Denies any injuries.  He cannot recall any repetitive activities that he does.  He does work as a Arts administrator.  He is right-hand dominant.   Review of Systems  Constitutional: Negative.   All other systems reviewed and are negative.    Objective: Vital Signs: There were no vitals taken for this visit.  Physical  Exam Vitals and nursing note reviewed.  Constitutional:      Appearance: He is well-developed.  Pulmonary:     Effort: Pulmonary effort is normal.  Abdominal:     Palpations: Abdomen is soft.  Skin:    General: Skin is warm.  Neurological:     Mental Status: He is alert and oriented to person, place, and time.  Psychiatric:        Behavior: Behavior normal.        Thought Content: Thought content normal.        Judgment: Judgment normal.     Ortho Exam Right elbow shows no swelling or lesions or masses.  He is point tender to the lateral epicondyle and the common extensor tendon.  Radial tunnel is asymptomatic.  Pain with resisted ECRB and wrist extension.  Normal range of motion of the elbow and supination pronation. Specialty Comments:  No specialty comments available.  Imaging: No results found.   PMFS History: Patient Active Problem List   Diagnosis Date Noted  . Lateral epicondylitis, right elbow 08/13/2020  . Analgesic overuse headache 02/07/2020  . Chronic left-sided headaches 02/07/2020  . Cigarette smoker 02/07/2020  . Insulin pump status 02/07/2020  . Chronic pain of left knee 02/20/2019   Past Medical History:  Diagnosis Date  . Anxiety   . Diabetes mellitus without  complication ([facility])   . ED (erectile dysfunction)   . Hyperlipidemia   . Hypertension   . Hypothyroid   . Insomnia   . Prostatitis   . Smoker     Family History  Problem Relation Age of Onset  . Depression Mother   . Lung disease Father   . Diabetes Father   . Hypertension Father     Past Surgical History:  Procedure Laterality Date  . WISDOM TOOTH EXTRACTION     Social History   Occupational History  . Not on file  Tobacco Use  . Smoking status: Current Every Day Smoker    Packs/day: 1.00    Years: 10.00    Pack years: 10.00    Types: Cigarettes  . Smokeless tobacco: Never Used  Substance and Sexual Activity  . Alcohol use: Yes    Alcohol/week: 0.0 standard drinks     Comment: 2 beers per dasy  . Drug use: No  . Sexual activity: Not on file

## 2020-09-02 ENCOUNTER — Telehealth: Payer: Self-pay | Admitting: Orthopaedic Surgery

## 2020-09-02 NOTE — Telephone Encounter (Signed)
Called and scheduled for 9am

## 2020-09-02 NOTE — Telephone Encounter (Signed)
Patient called requesting a call back. Patient states to be in severe pain in left knee can barely bare weight on it. Please call patient at [phone]

## 2020-09-02 NOTE — Telephone Encounter (Signed)
Sure we can see him tomorrow morning

## 2020-09-02 NOTE — Telephone Encounter (Signed)
See message below.  Would you like for him to come in?

## 2020-09-03 ENCOUNTER — Ambulatory Visit: Payer: Self-pay

## 2020-09-03 ENCOUNTER — Ambulatory Visit: Payer: BC Managed Care – PPO | Admitting: Orthopaedic Surgery

## 2020-09-03 DIAGNOSIS — M25562 Pain in left knee: Secondary | ICD-10-CM | POA: Diagnosis not present

## 2020-09-03 MED ORDER — BUPIVACAINE HCL 0.5 % IJ SOLN
2.0000 mL | INTRAMUSCULAR | Status: AC | PRN
  Administered 2020-09-03: 2 mL via INTRA_ARTICULAR

## 2020-09-03 MED ORDER — METHYLPREDNISOLONE ACETATE 40 MG/ML IJ SUSP
40.0000 mg | INTRAMUSCULAR | Status: AC | PRN
  Administered 2020-09-03: 40 mg via INTRA_ARTICULAR

## 2020-09-03 MED ORDER — LIDOCAINE HCL 1 % IJ SOLN
2.0000 mL | INTRAMUSCULAR | Status: AC | PRN
  Administered 2020-09-03: 2 mL

## 2020-09-03 NOTE — Progress Notes (Signed)
Office Visit Note   Patient: Thomas Richard           Date of Birth: Sep 03, 1971           MRN: [id number] Visit Date: 09/03/2020              Requested by: Shon Baton, [place],  [place] [address] PCP: Shon Baton, MD   Assessment & Plan: Visit Diagnoses:  1. Acute pain of left knee     Plan: Impression is acute left knee pain. Not exactly sure of the etiology of the popping. Since this is very similar to last years episode which resolved fairly quickly in a week or 2 with a cortisone injection we decided to repeat the injection this time. If he does not notice any improvement we will get an MRI to rule out structural abnormalities.  Follow-Up Instructions: Return if symptoms worsen or fail to improve.   Orders:  Orders Placed This Encounter  Procedures  . XR KNEE 3 VIEW LEFT   No orders of the defined types were placed in this encounter.     Procedures: Large Joint Inj: L knee on 09/03/2020 10:12 AM Details: 22 G needle Medications: 2 mL bupivacaine 0.5 %; 2 mL lidocaine 1 %; 40 mg methylPREDNISolone acetate 40 MG/ML Outcome: tolerated well, no immediate complications Patient was prepped and draped in the usual sterile fashion.       Clinical Data: No additional findings.   Subjective: Chief Complaint  Patient presents with  . Left Knee - Pain    Thomas Richard is a gentleman who is well-known to me who comes in for evaluation of acute left knee pain. He had a similar episode about a year and a half ago which she was stretching and his knee popped and he had severe pain afterwards and was unable to flex or straighten his knee all the way. He had MRI that time that was negative for structural abnormalities. He states that he has been walking with a brace and this has helped some. He is still limping and unable to straighten the knee all the way. He feels like it locks up. Denies any swelling. Feels reminiscent of the event last year.   Review of  Systems  Constitutional: Negative.   All other systems reviewed and are negative.    Objective: Vital Signs: There were no vitals taken for this visit.  Physical Exam Vitals and nursing note reviewed.  Constitutional:      Appearance: He is well-developed.  Pulmonary:     Effort: Pulmonary effort is normal.  Abdominal:     Palpations: Abdomen is soft.  Skin:    General: Skin is warm.  Neurological:     Mental Status: He is alert and oriented to person, place, and time.  Psychiatric:        Behavior: Behavior normal.        Thought Content: Thought content normal.        Judgment: Judgment normal.     Ortho Exam Left knee shows a trace effusion. No significant joint line tenderness. Pain to flexion of the knee beyond 90 degrees. Negative [place]. Collaterals and cruciates are stable. Patellar mobility is normal and negative patellar apprehension. Good strength to manual muscle testing.  Specialty Comments:  No specialty comments available.  Imaging: XR KNEE 3 VIEW LEFT  Result Date: 09/03/2020 No acute or structural abnormalities    PMFS History: Patient Active Problem List   Diagnosis Date Noted  .  Lateral epicondylitis, right elbow 08/13/2020  . Analgesic overuse headache 02/07/2020  . Chronic left-sided headaches 02/07/2020  . Cigarette smoker 02/07/2020  . Insulin pump status 02/07/2020  . Chronic pain of left knee 02/20/2019   Past Medical History:  Diagnosis Date  . Anxiety   . Diabetes mellitus without complication ([facility])   . ED (erectile dysfunction)   . Hyperlipidemia   . Hypertension   . Hypothyroid   . Insomnia   . Prostatitis   . Smoker     Family History  Problem Relation Age of Onset  . Depression Mother   . Lung disease Father   . Diabetes Father   . Hypertension Father     Past Surgical History:  Procedure Laterality Date  . WISDOM TOOTH EXTRACTION     Social History   Occupational History  . Not on file  Tobacco Use  .  Smoking status: Current Every Day Smoker    Packs/day: 1.00    Years: 10.00    Pack years: 10.00    Types: Cigarettes  . Smokeless tobacco: Never Used  Substance and Sexual Activity  . Alcohol use: Yes    Alcohol/week: 0.0 standard drinks    Comment: 2 beers per dasy  . Drug use: No  . Sexual activity: Not on file

## 2021-04-20 ENCOUNTER — Other Ambulatory Visit: Payer: Self-pay | Admitting: Specialist

## 2021-04-20 DIAGNOSIS — M316 Other giant cell arteritis: Secondary | ICD-10-CM

## 2021-04-20 DIAGNOSIS — G501 Atypical facial pain: Secondary | ICD-10-CM

## 2021-05-09 ENCOUNTER — Ambulatory Visit
Admission: RE | Admit: 2021-05-09 | Discharge: 2021-05-09 | Disposition: A | Discharge status: Home / Self Care | Payer: BC Managed Care – PPO | Source: Ambulatory Visit | Attending: Specialist | Admitting: Specialist

## 2021-05-09 ENCOUNTER — Other Ambulatory Visit: Payer: Self-pay

## 2021-05-09 DIAGNOSIS — G501 Atypical facial pain: Secondary | ICD-10-CM

## 2021-05-09 DIAGNOSIS — M316 Other giant cell arteritis: Secondary | ICD-10-CM

## 2021-05-09 IMAGING — MR MR HEAD WO/W CM
25 series · 48 of 48 positions shown · IV contrast (20 mL Multihance)
Comparison: None.

CLINICAL DATA: Atypical facial pain [22] ([22]-CM)

Temporal arteritis (HCC) [22] ([22]-CM)
EXAM:
MRI HEAD WITHOUT AND WITH CONTRAST
TECHNIQUE: Multiplanar, multiecho pulse sequences of the brain and surrounding
structures were obtained without and with intravenous contrast.
CONTRAST:  20mL MULTIHANCE GADOBENATE DIMEGLUMINE 529 MG/ML IV SOLN

[Series 1: T1 · sagittal · 5.0mm · 0.51mm/px · 1 of 24 slices shown (1 of 4)]
[im 1/24]
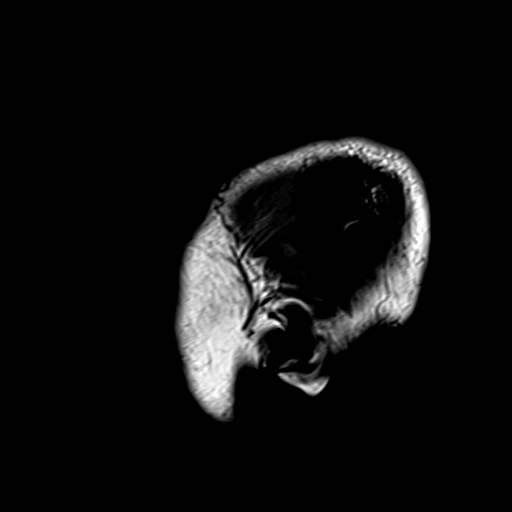

[Series 2: ax ep2d_diff_3 · axial · 3.0mm · 1.88mm/px · z∈[-61,+98]mm · 3 of 108 slices shown]
[im 1/108]
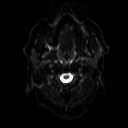
[im 54/108]
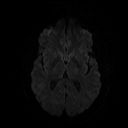
[im 108/108]
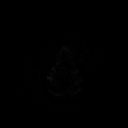

[Series 3: ax ep2d_diff_3_adc · axial · 3.0mm · 1.88mm/px · z∈[-61,+98]mm · 2 of 54 slices shown]
[im 1/54]
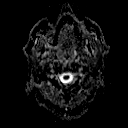
[im 54/54]
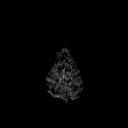

[Series 4: cor ep2d_diff · coronal · 5.0mm · 1.77mm/px · 2 of 60 slices shown]
[im 1/60]
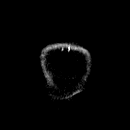
[im 60/60]
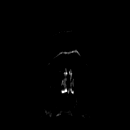

[Series 5: cor ep2d_diff_adc · coronal · 5.0mm · 1.77mm/px · 1 of 30 slices shown]
[im 1/30]
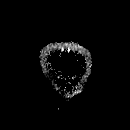

[Series 5: T1 · sagittal · 4.0mm · 0.84mm/px · 1 of 32 slices shown (2 of 4)]
[im 1/32]
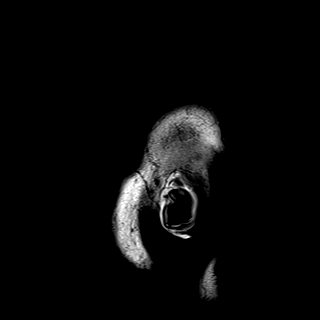

[Series 6: mip_images(sw) · axial · 16.0mm · 1.02mm/px · z∈[-53,+88]mm · 2 of 73 slices shown]
[im 1/73]
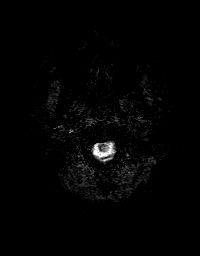
[im 73/73]
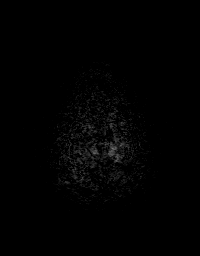

[Series 6: DWI · axial · 3.0mm · 1.02mm/px · z∈[-65,+89]mm · 5 of 180 slices shown (1 of 3)]
[im 1/180]
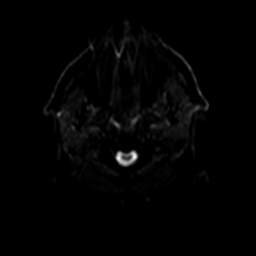
[im 45/180]
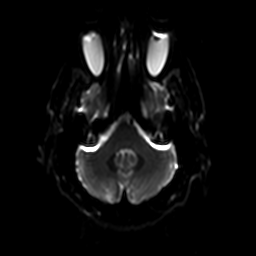
[im 90/180]
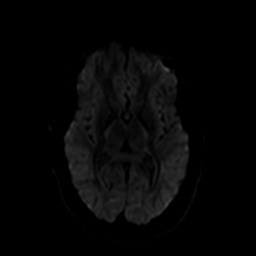
[im 135/180]
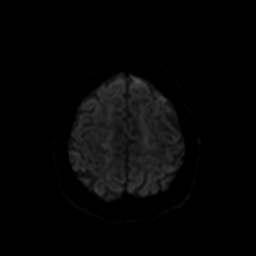
[im 180/180]
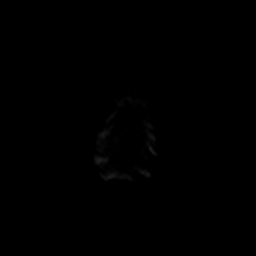

[Series 7: ax dwi_tracew · axial · 3.0mm · 1.02mm/px · z∈[-65,+89]mm · 2 of 89 slices shown]
[im 1/89]
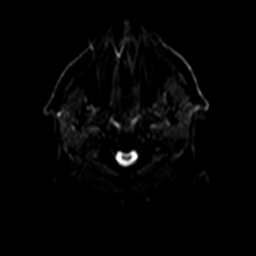
[im 89/89]
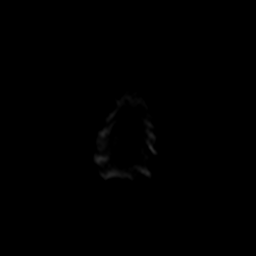

[Series 7: swi_images · axial · 2.0mm · 1.02mm/px · z∈[-60,+95]mm · 2 of 80 slices shown (1 of 2)]
[im 1/80]
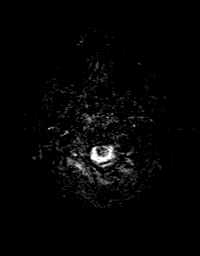
[im 80/80]
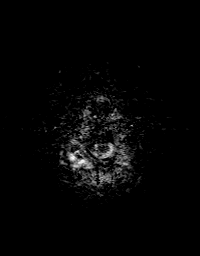

[Series 8: ax dwi_adc · axial · 3.0mm · 1.02mm/px · 1 of 45 slices shown]
[im 1/45]
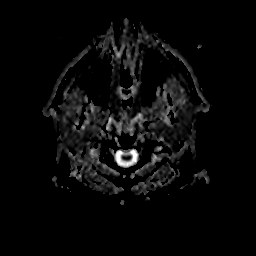

[Series 8: T2 · axial · 5.0mm · 0.81mm/px · 1 of 29 slices shown (1 of 3)]
[im 1/29]
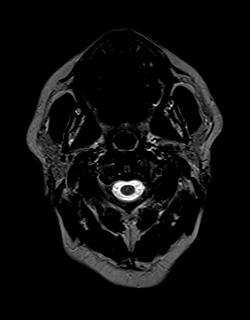

[Series 9: DWI · coronal · 5.0mm · 1.44mm/px · 2 of 68 slices shown (2 of 3)]
[im 1/68]
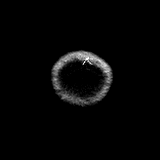
[im 68/68]
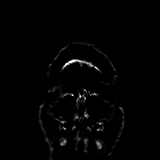

[Series 9: FLAIR · axial · 3.0mm · 0.51mm/px · 1 of 43 slices shown (1 of 2)]
[im 1/43]
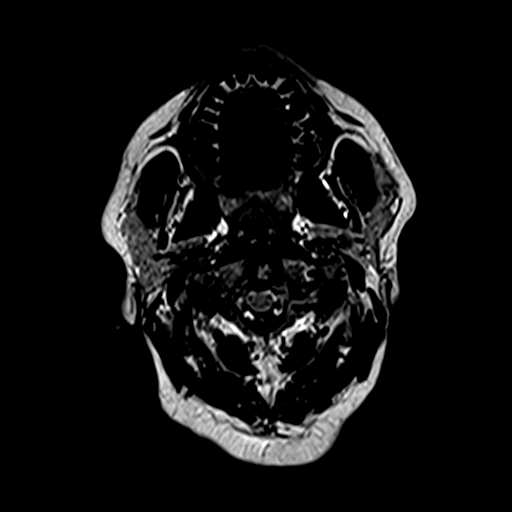

[Series 10: t1_mpr_tra · axial · 1.0mm · 0.81mm/px · z∈[-60,+96]mm · 4 of 160 slices shown]
[im 1/160]
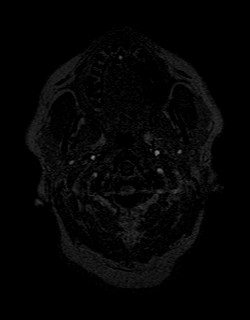
[im 54/160]
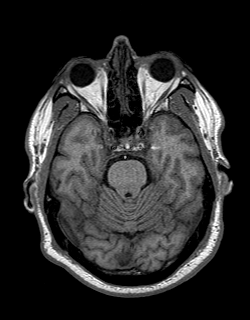
[im 107/160]
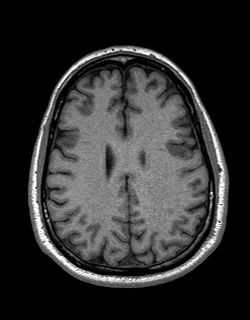
[im 160/160]
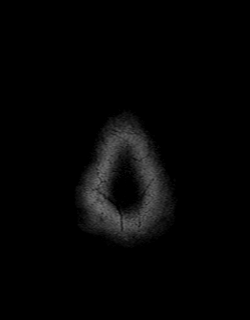

[Series 10: DWI · coronal · 5.0mm · 1.44mm/px · 1 of 34 slices shown (3 of 3)]
[im 1/34]
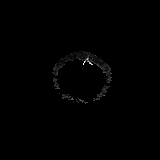

[Series 11: T2 · axial · 4.0mm · 0.39mm/px · 1 of 32 slices shown (2 of 3)]
[im 1/32]
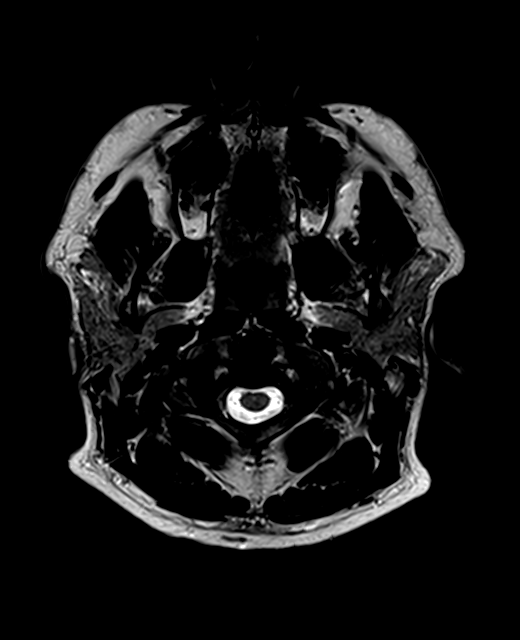

[Series 11: T2 · coronal · 5.0mm · 0.43mm/px · 1 of 30 slices shown (3 of 3)]
[im 1/30]
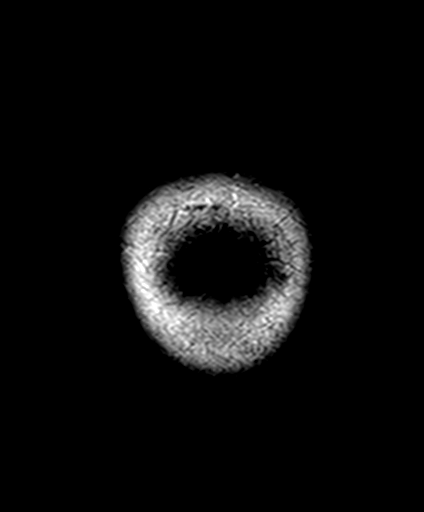

[Series 12: FLAIR · axial · 3.0mm · 0.78mm/px · 1 of 28 slices shown (2 of 2)]
[im 1/28]
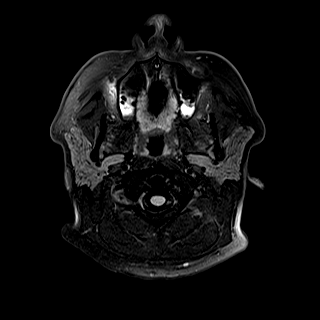

[Series 13: swi_images · axial · 1.5mm · 0.98mm/px · z∈[-65,+86]mm · 3 of 104 slices shown (2 of 2)]
[im 1/104]
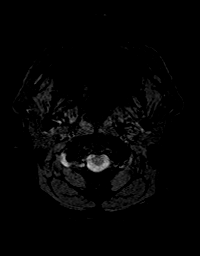
[im 52/104]
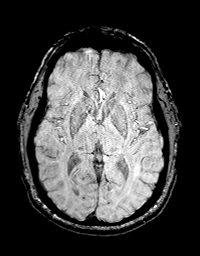
[im 104/104]
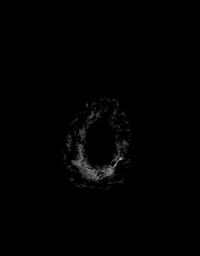

[Series 15: T1 · axial · 1.0mm · 0.98mm/px · z∈[-67,+88]mm · 4 of 160 slices shown (3 of 4)]
[im 1/160]
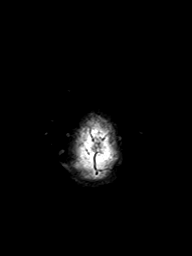
[im 54/160]
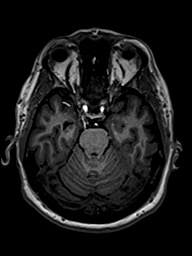
[im 107/160]
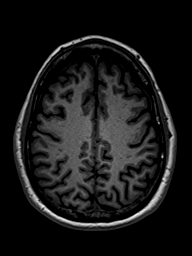
[im 160/160]
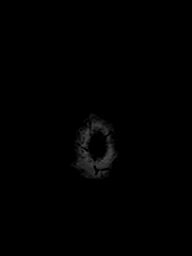

[Series 16: T2 post-contrast · coronal · 5.0mm · 0.36mm/px · 1 of 34 slices shown]
[im 1/34]
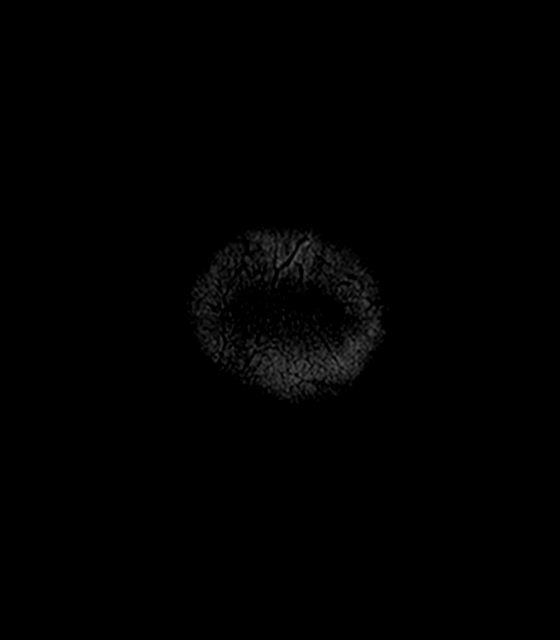

[Series 17: T1 · axial · 1.0mm · 0.98mm/px · z∈[-67,+88]mm · 4 of 160 slices shown (4 of 4)]
[im 1/160]
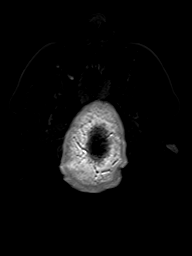
[im 54/160]
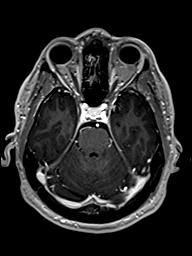
[im 107/160]
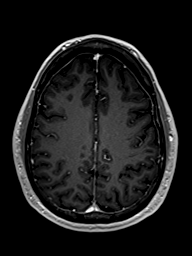
[im 160/160]
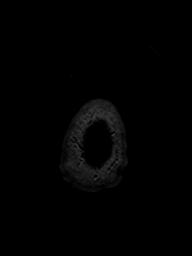

[Series 18: T1 post-contrast · coronal · 5.0mm · 0.72mm/px · 1 of 34 slices shown (1 of 2)]
[im 1/34]
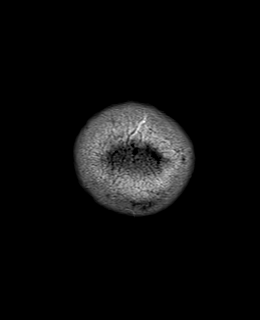

[Series 19: T1 post-contrast · sagittal · 4.0mm · 0.84mm/px · 1 of 32 slices shown (2 of 2)]
[im 1/32]
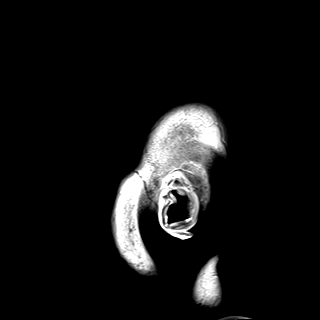

[48 of 48 positions shown; findings below may reference images not displayed]

FINDINGS: Brain: No acute infarction, hemorrhage, hydrocephalus, extra-axial
collection or intraparenchymal brain mass lesion. Mild atrophy. No
abnormal intraparenchymal enhancement. Incidental small right
cerebellar developmental venous anomaly.

Vascular: Major arterial flow voids are maintained at the skull
base.

Skull and upper cervical spine: Normal marrow signal.

Sinuses/Orbits: Mild paranasal sinus mucosal thickening.

Other: There appears to be an approximately 1.6 cm enhancing mass
within the posteroinferior right parotid gland, which is
incompletely imaged on the reformatted imaging (see series 16, image
22; series 5, image 5) and not imaged on axial sequences.
IMPRESSION: 1. No evidence of acute intracranial abnormality.
2. Suspected 1.6 cm posteroinferior right parotid mass, incompletely
imaged/characterized on this study. Recommend CT neck (preferably
with contrast) to confirm and further evaluate.

## 2021-05-09 MED ORDER — GADOBENATE DIMEGLUMINE 529 MG/ML IV SOLN
20.0000 mL | Freq: Once | INTRAVENOUS | Status: AC | PRN
  Administered 2021-05-09: 20 mL via INTRAVENOUS

## 2021-05-14 ENCOUNTER — Other Ambulatory Visit: Payer: Self-pay | Admitting: Specialist

## 2021-05-14 DIAGNOSIS — K118 Other diseases of salivary glands: Secondary | ICD-10-CM

## 2021-06-04 ENCOUNTER — Ambulatory Visit
Admission: RE | Admit: 2021-06-04 | Discharge: 2021-06-04 | Disposition: A | Discharge status: Home / Self Care | Payer: BC Managed Care – PPO | Source: Ambulatory Visit | Attending: Specialist | Admitting: Specialist

## 2021-06-04 DIAGNOSIS — K118 Other diseases of salivary glands: Secondary | ICD-10-CM

## 2021-06-04 IMAGING — CT CT NECK W/ CM
1 of 2 series · 9 of 14 positions shown, 12 images · IV contrast (iopamidol)
Comparison: Brain MRI [DATE].

CLINICAL DATA: Mass of right parotid gland. Additional history
provided by scanning technologist: Patient reports facial pain.

EXAM:
CT NECK WITH CONTRAST
TECHNIQUE: Multidetector CT imaging of the neck was performed using the
standard protocol following the bolus administration of intravenous
contrast.
CONTRAST:  75mL [WQ] IOPAMIDOL ([WQ]) INJECTION 61%

[Series 3: neck · axial · 0.43mm/px · z∈[-272,-62]mm · 9 of 133 slices shown, 12 images]
[im 14/133  soft-tissue]
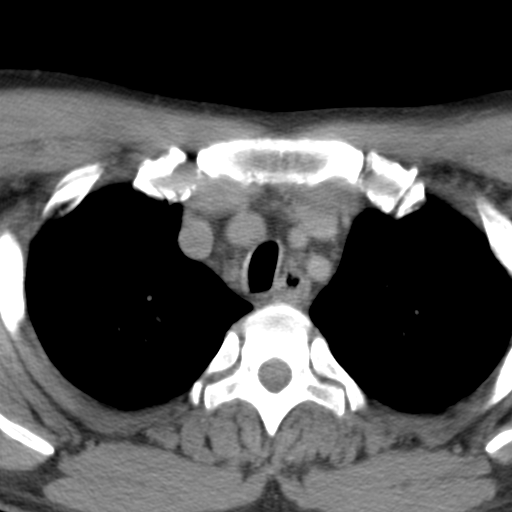
[im 14/133  bone]
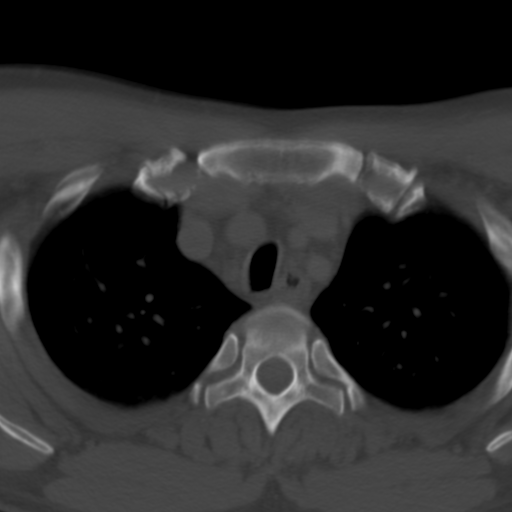
[im 27/133  bone]
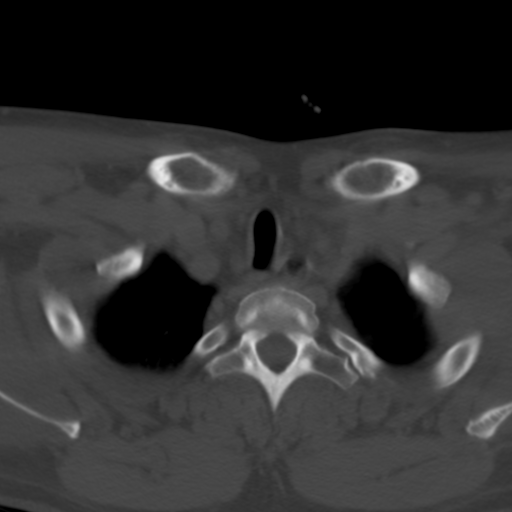
[im 40/133  bone]
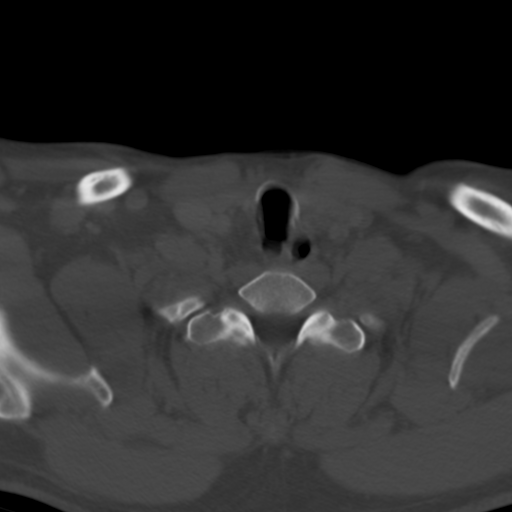
[im 53/133  bone]
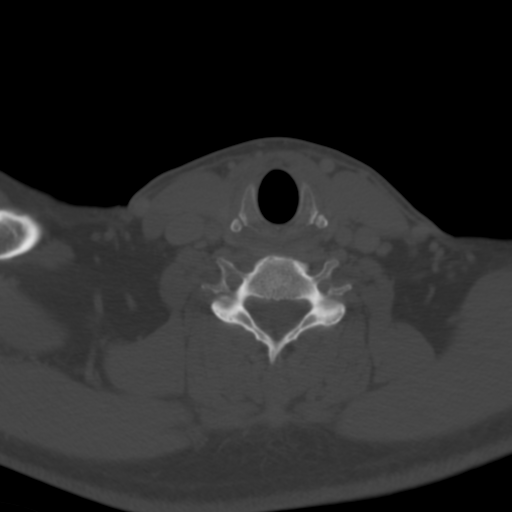
[im 67/133  soft-tissue]
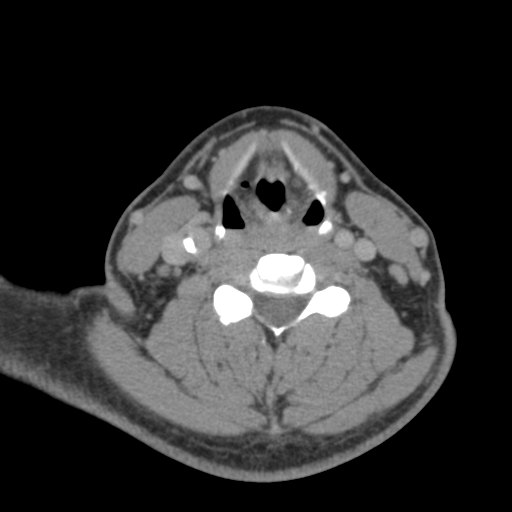
[im 67/133  bone]
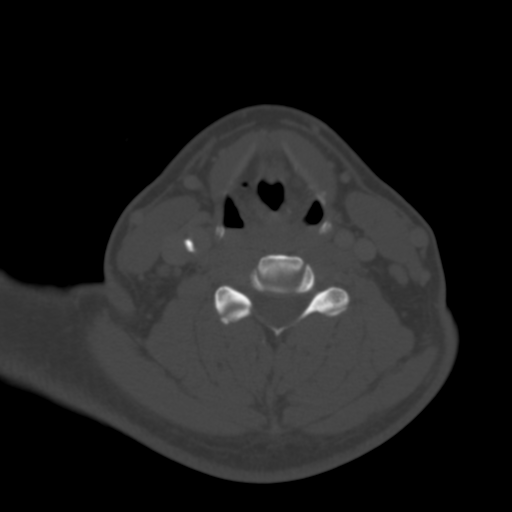
[im 80/133  bone]
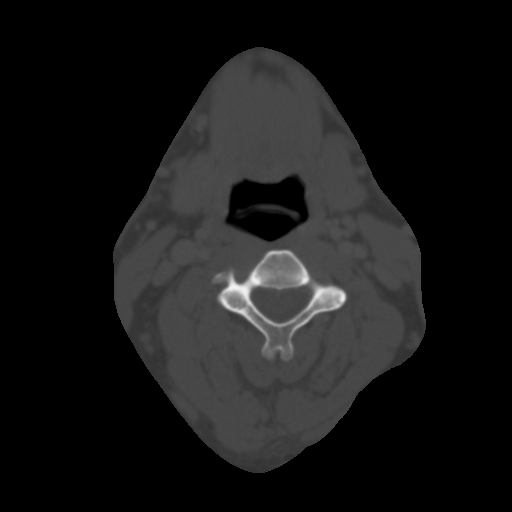
[im 93/133  bone]
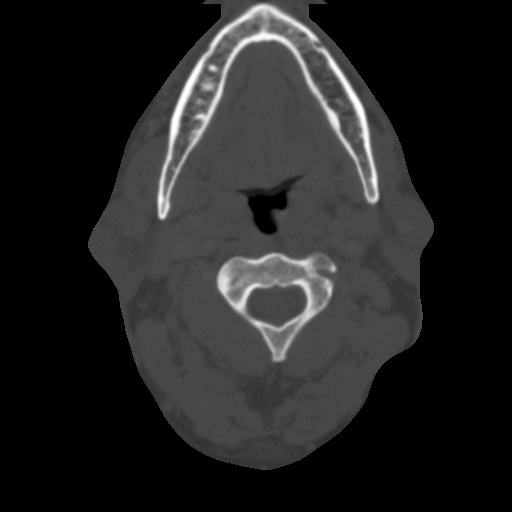
[im 106/133  bone]
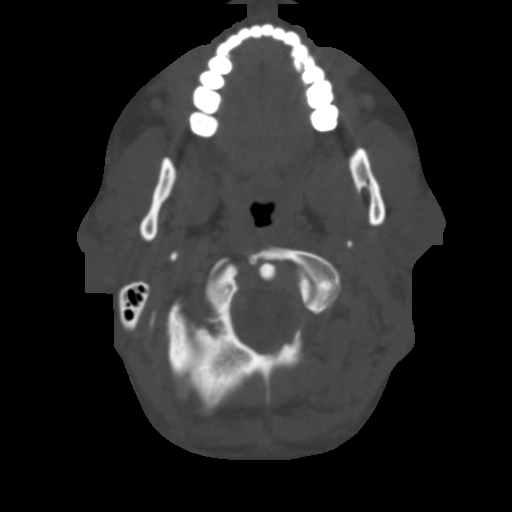
[im 119/133  soft-tissue]
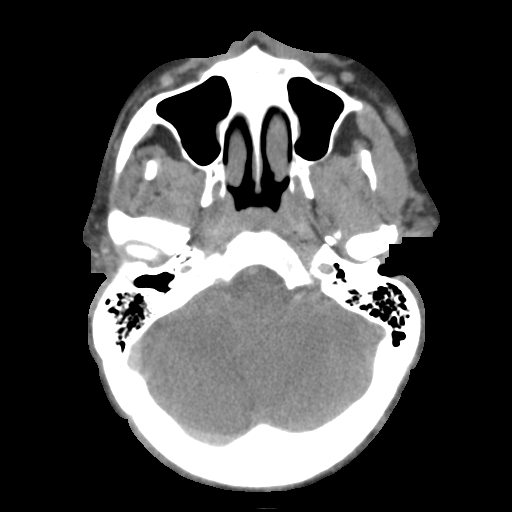
[im 119/133  bone]
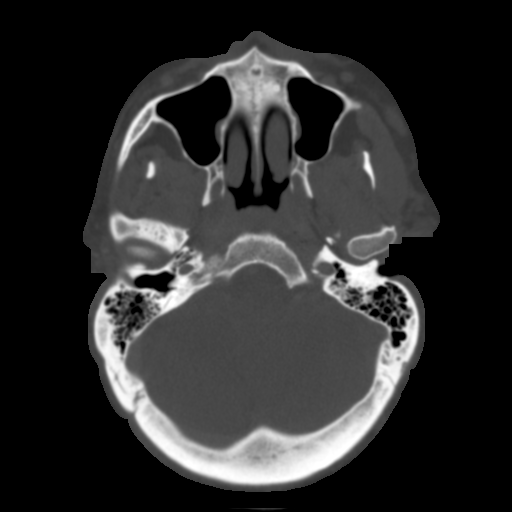

[9 of 14 positions shown; findings below may reference images not displayed]

FINDINGS: Pharynx and larynx: No appreciable swelling or discrete mass within
the oral cavity, pharynx or larynx.

Salivary glands: 2.1 x 1.4 cm ovoid soft tissue focus within/along
the inferior aspect of the right parotid gland (for instance as seen
on series 6, image 18) (series 3, image 45). The left parotid and
bilateral submandibular glands are unremarkable.

Thyroid: Unremarkable.

Lymph nodes: 2.1 x 1.4 cm ovoid soft tissue focus within/along the
inferior aspect of the right parotid gland. Elsewhere within the
neck, no pathologically enlarged cervical chain lymph nodes are
identified.

Vascular: The major vascular structures of the neck are patent.
Calcified atherosclerotic plaque within the carotid bifurcations
bilaterally.

Limited intracranial: No evidence of acute intracranial mallet
within the field of view.

Visualized orbits: Largely excluded from the field of view. No mass
or acute finding at the imaged levels.

Mastoids and visualized paranasal sinuses: Trace bilateral ethmoid
sinus mucosal thickening at the imaged levels. No significant
mastoid effusion.

Skeleton: Cervical spondylosis. Most notably at C5-C6, there is
moderate disc degeneration, 2 mm grade 1 retrolisthesis, disc bulge
and bilateral uncovertebral hypertrophy. Left bony neural foraminal
narrowing and at least mild spinal canal stenosis at this level.

Upper chest: No consolidation within the imaged lung apices.
IMPRESSION: 2.1 x 1.4 cm ovoid soft tissue focus within/along the inferior
aspect of the right parotid gland. This is favored to reflect a
primary parotid neoplasm. Alternatively, this could reflect a
pathologically enlarged intraparotid/periparotid lymph node. ENT
consultation and direct tissue sampling should be considered.

Elsewhere within the neck, there is no cervical lymphadenopathy.

Cervical spondylosis, greatest at C5-C6.

Calcified plaque within the carotid bifurcations bilaterally.

## 2021-06-04 MED ORDER — IOPAMIDOL (ISOVUE-300) INJECTION 61%
75.0000 mL | Freq: Once | INTRAVENOUS | Status: AC | PRN
  Administered 2021-06-04: 75 mL via INTRAVENOUS

## 2021-06-18 ENCOUNTER — Other Ambulatory Visit (HOSPITAL_COMMUNITY): Payer: Self-pay | Admitting: Otolaryngology

## 2021-06-18 ENCOUNTER — Encounter (HOSPITAL_COMMUNITY): Payer: Self-pay | Admitting: Radiology

## 2021-06-18 DIAGNOSIS — K118 Other diseases of salivary glands: Secondary | ICD-10-CM

## 2021-06-18 NOTE — Progress Notes (Signed)
Patient Name  Thomas, Richard Legal Sex  Male DOB  [birth year] SSN  [id number] Address  [address]  [place] [address] Phone  [phone] (Home)  [phone] (Mobile)    RE: [place] FNA SALIVARY GLAND/PAROTID GLAND Received: Today Arne Cleveland, MD  Arlyn Leak   [place] FNA R parotid nodule   DDH        Previous Messages   ----- Message -----  From: Garth Bigness D  Sent: 06/18/2021  11:44 AM EDT  To: [facility] Procedure Requests  Subject: [place] FNA SALIVARY GLAND/PAROTID GLAND             Procedure:  [place] FNA SALIVARY GLAND/PAROTID GLAND   Reason:  Parotid mass   History:  CT in computer   IMPRESSION:  2.1 x 1.4 cm ovoid soft tissue focus within/along the inferior  aspect of the right parotid gland. 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 Alternatively, this could reflect a  pathologically enlarged intraparotid/periparotid lymph node. ENT  consultation and direct tissue sampling should be considered.     Elsewhere within the neck, there is no cervical lymphadenopathy.     Cervical spondylosis, greatest at C5-C6.     Calcified plaque within the carotid bifurcations bilaterally.        Electronically Signed    By: Kellie Simmering D.O.    On: 06/05/2021 13:49    Provider:  Leta Baptist   Provider Contact:  [phone]

## 2021-06-29 ENCOUNTER — Other Ambulatory Visit: Payer: Self-pay | Admitting: Radiology

## 2021-06-30 ENCOUNTER — Other Ambulatory Visit (HOSPITAL_COMMUNITY): Payer: Self-pay | Admitting: Otolaryngology

## 2021-06-30 ENCOUNTER — Other Ambulatory Visit: Payer: Self-pay

## 2021-06-30 ENCOUNTER — Ambulatory Visit (HOSPITAL_COMMUNITY)
Admission: RE | Admit: 2021-06-30 | Discharge: 2021-06-30 | Disposition: A | Discharge status: Home / Self Care | Payer: BC Managed Care – PPO | Source: Ambulatory Visit | Attending: Otolaryngology | Admitting: Otolaryngology

## 2021-06-30 ENCOUNTER — Encounter (HOSPITAL_COMMUNITY): Payer: Self-pay

## 2021-06-30 DIAGNOSIS — Z8249 Family history of ischemic heart disease and other diseases of the circulatory system: Secondary | ICD-10-CM | POA: Diagnosis not present

## 2021-06-30 DIAGNOSIS — I10 Essential (primary) hypertension: Secondary | ICD-10-CM | POA: Insufficient documentation

## 2021-06-30 DIAGNOSIS — K118 Other diseases of salivary glands: Secondary | ICD-10-CM | POA: Diagnosis present

## 2021-06-30 DIAGNOSIS — E785 Hyperlipidemia, unspecified: Secondary | ICD-10-CM | POA: Insufficient documentation

## 2021-06-30 DIAGNOSIS — Z7982 Long term (current) use of aspirin: Secondary | ICD-10-CM | POA: Insufficient documentation

## 2021-06-30 DIAGNOSIS — Z79899 Other long term (current) drug therapy: Secondary | ICD-10-CM | POA: Insufficient documentation

## 2021-06-30 DIAGNOSIS — E119 Type 2 diabetes mellitus without complications: Secondary | ICD-10-CM | POA: Diagnosis not present

## 2021-06-30 DIAGNOSIS — Z794 Long term (current) use of insulin: Secondary | ICD-10-CM | POA: Insufficient documentation

## 2021-06-30 DIAGNOSIS — F17210 Nicotine dependence, cigarettes, uncomplicated: Secondary | ICD-10-CM | POA: Insufficient documentation

## 2021-06-30 DIAGNOSIS — D110 Benign neoplasm of parotid gland: Secondary | ICD-10-CM | POA: Diagnosis not present

## 2021-06-30 DIAGNOSIS — E039 Hypothyroidism, unspecified: Secondary | ICD-10-CM | POA: Insufficient documentation

## 2021-06-30 DIAGNOSIS — Z79890 Hormone replacement therapy (postmenopausal): Secondary | ICD-10-CM | POA: Insufficient documentation

## 2021-06-30 LAB — CBC
HCT: 42.8 % (ref 39.0–52.0)
Hemoglobin: 14.9 g/dL (ref 13.0–17.0)
MCH: 33.3 pg (ref 26.0–34.0)
MCHC: 34.8 g/dL (ref 30.0–36.0)
MCV: 95.7 fL (ref 80.0–100.0)
Platelets: 294 10*3/uL (ref 150–400)
RBC: 4.47 MIL/uL (ref 4.22–5.81)
RDW: 12.9 % (ref 11.5–15.5)
WBC: 9.2 10*3/uL (ref 4.0–10.5)
nRBC: 0 % (ref 0.0–0.2)

## 2021-06-30 LAB — PROTIME-INR
INR: 0.9 (ref 0.8–1.2)
Prothrombin Time: 12.5 seconds (ref 11.4–15.2)

## 2021-06-30 LAB — APTT: aPTT: 29 seconds (ref 24–36)

## 2021-06-30 LAB — GLUCOSE, CAPILLARY: Glucose-Capillary: 153 mg/dL — ABNORMAL HIGH (ref 70–99)

## 2021-06-30 IMAGING — US IR BIOPSY CORE SALIVARY GLAND
1 series · 13 of 24 positions shown · non-contrast
Comparison: none

INDICATION: 49-year-old male with a history of right parotid mass, presents for
biopsy

[Series 1: us fna biopsy salivary gland parotid gland 1st les · 13 of 24 slices shown]
[im 1/24]
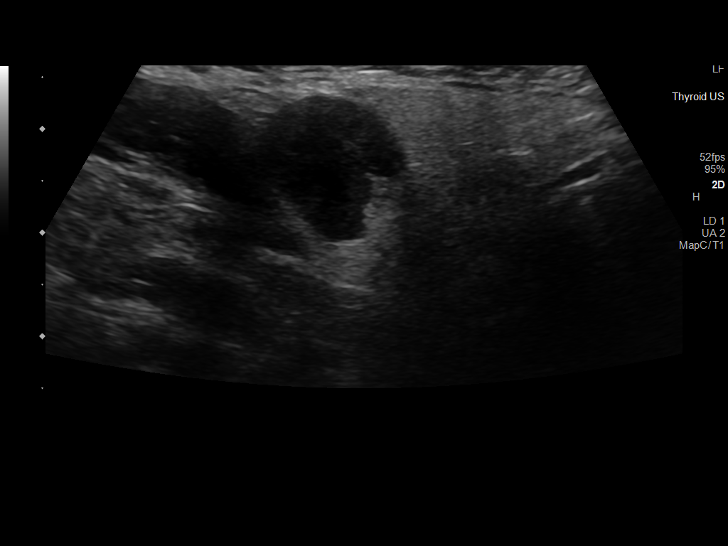
[im 3/24]
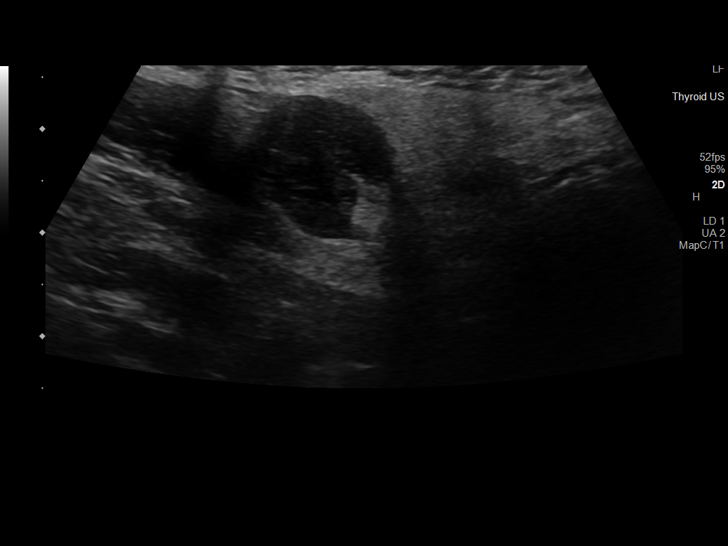
[im 5/24]
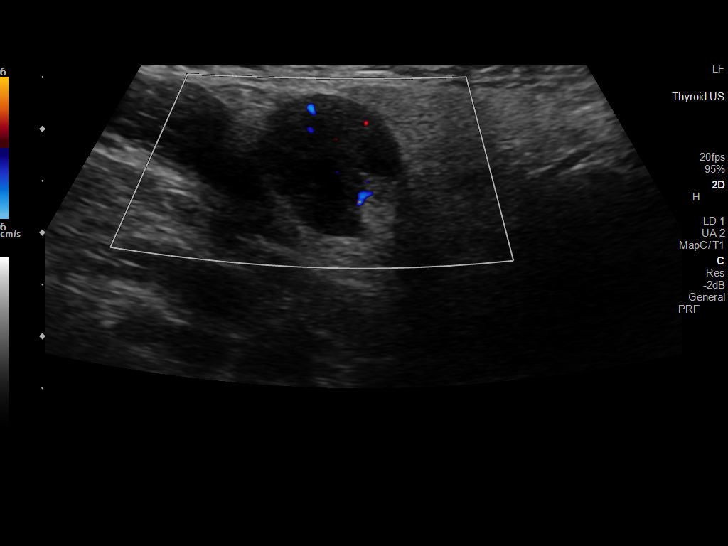
[im 7/24]
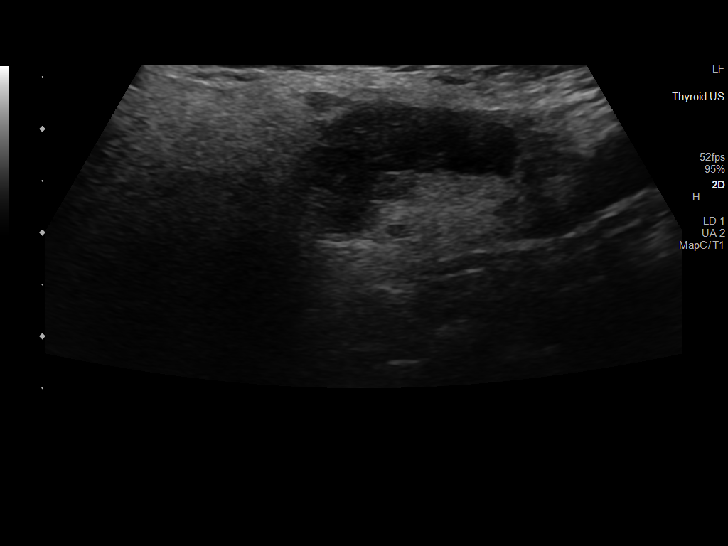
[im 9/24]
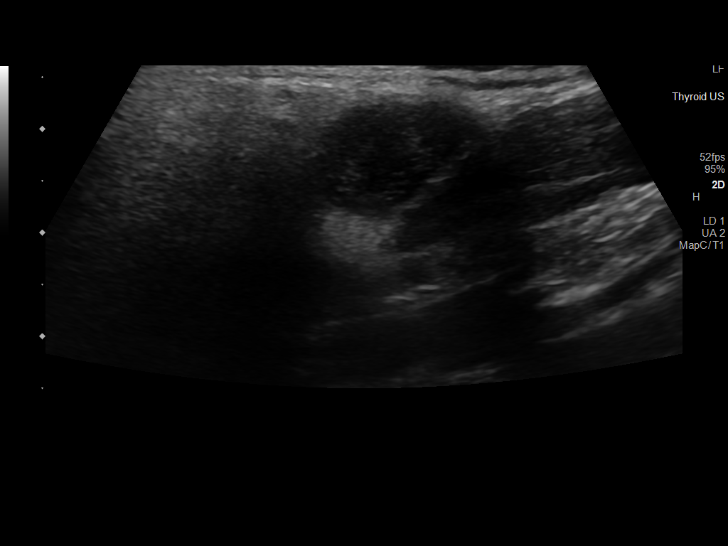
[im 11/24]
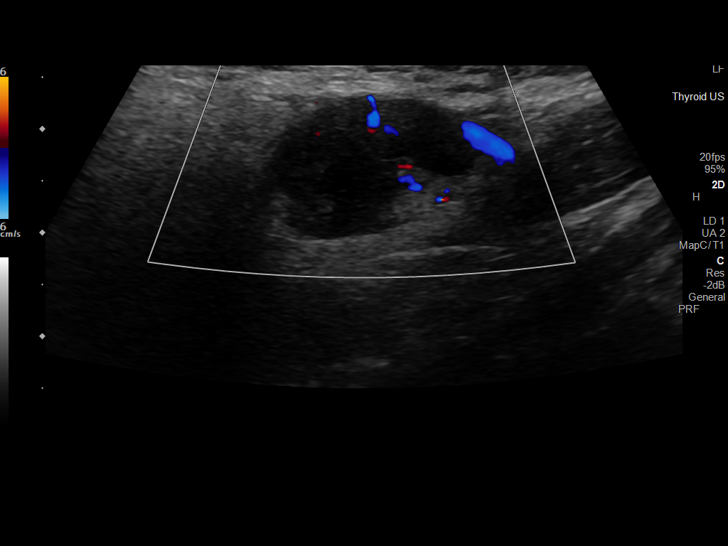
[im 13/24]
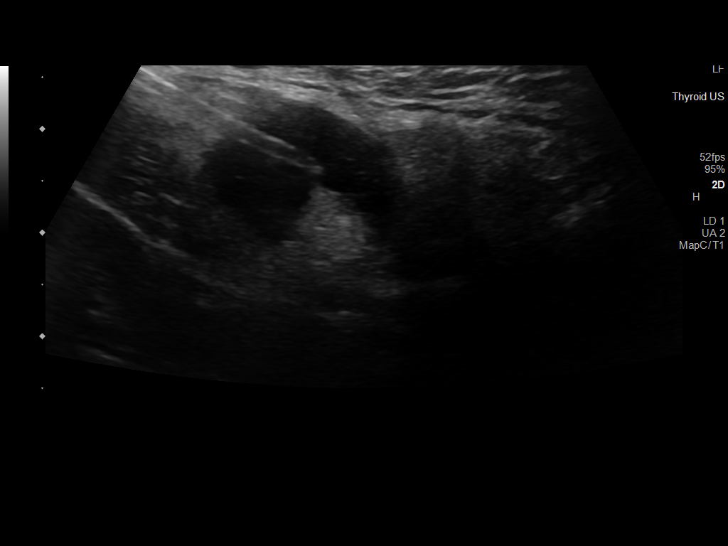
[im 14/24]
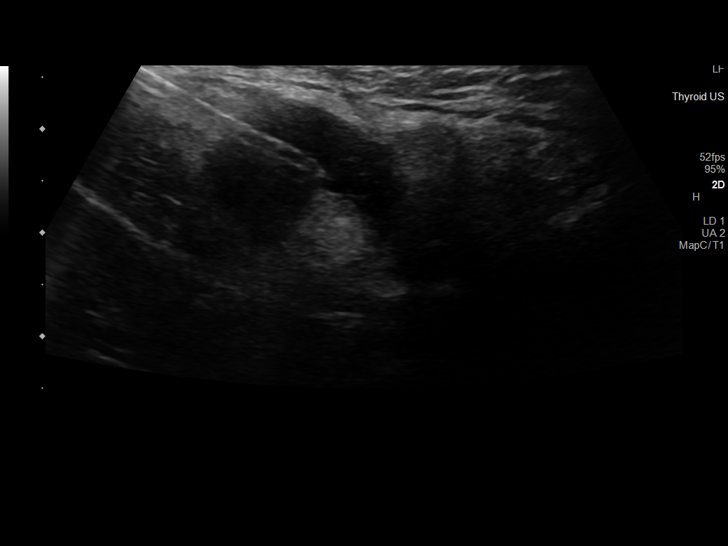
[im 16/24]
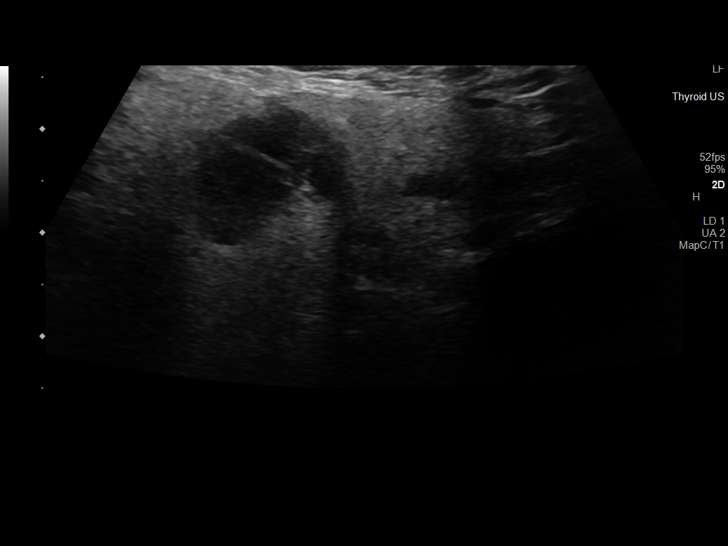
[im 18/24]
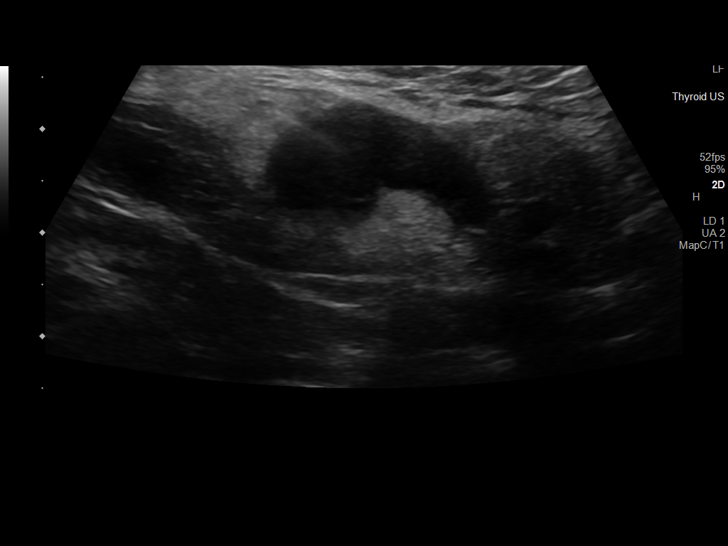
[im 20/24]
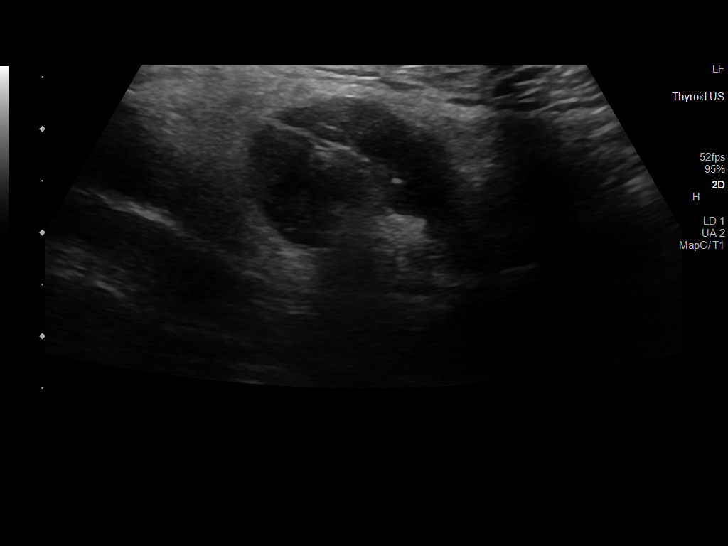
[im 22/24]
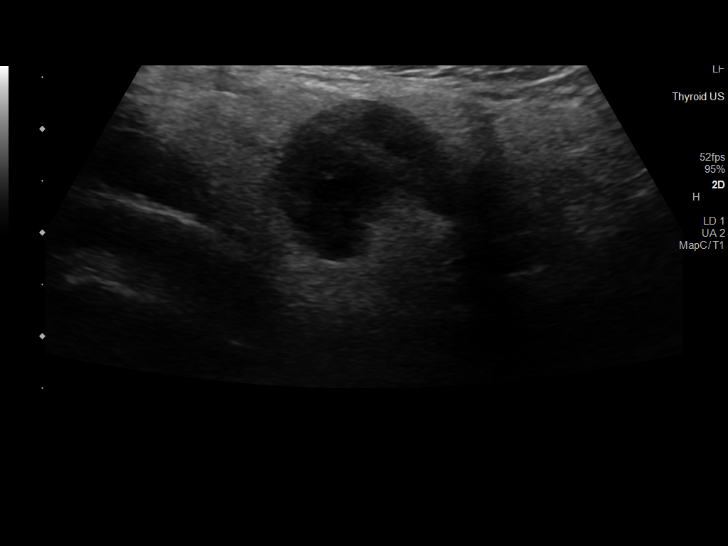
[im 24/24]
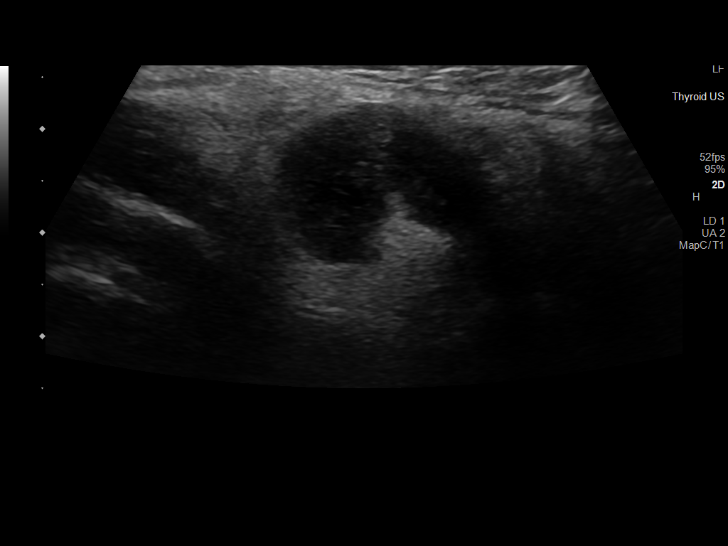

[13 of 24 positions shown; findings below may reference images not displayed]

EXAM:
SALIVARY GLAND CORE BIOPSY

MEDICATIONS:
None.

ANESTHESIA/SEDATION:
Moderate (conscious) sedation was employed during this procedure. A
total of Versed 2.0 mg and Fentanyl 100 mcg was administered
intravenously.

Moderate Sedation Time: 16 minutes. The patient's level of
consciousness and vital signs were monitored continuously by
radiology nursing throughout the procedure under my direct
supervision.

FLUOROSCOPY TIME:  Ultrasound

COMPLICATIONS:
None

PROCEDURE:
Informed written consent was obtained from the patient after a
thorough discussion of the procedural risks, benefits and
alternatives. All questions were addressed. Maximal Sterile Barrier
Technique was utilized including caps, mask, sterile gowns, sterile
gloves, sterile drape, hand hygiene and skin antiseptic. A timeout
was performed prior to the initiation of the procedure.

Ultrasound survey was performed with images stored and sent to PACs.

The right neck was prepped with chlorhexidine in a sterile fashion,
and a sterile drape was applied covering the operative field. A
sterile gown and sterile gloves were used for the procedure. Local
anesthesia was provided with 1% Lidocaine.

Ultrasound guidance was used to infiltrate the region with 1%
lidocaine for local anesthesia. Multiple 18 gauge core needle biopsy
were then acquired of the superficial right parotid nodule using
ultrasound guidance. Images were stored.

Specimen placed into saline.

Final image was stored after biopsy.

Patient tolerated the procedure well and remained hemodynamically
stable throughout.

No complications were encountered and no significant blood loss was
encounter
IMPRESSION: Status post ultrasound-guided biopsy of right parotid nodule.

## 2021-06-30 MED ORDER — MIDAZOLAM HCL 2 MG/2ML IJ SOLN
INTRAMUSCULAR | Status: AC
  Filled 2021-06-30: qty 2

## 2021-06-30 MED ORDER — FENTANYL CITRATE (PF) 100 MCG/2ML IJ SOLN
INTRAMUSCULAR | Status: DC | PRN
  Administered 2021-06-30 (×2): 50 ug via INTRAVENOUS

## 2021-06-30 MED ORDER — GELATIN ABSORBABLE 12-7 MM EX MISC
CUTANEOUS | Status: AC
  Filled 2021-06-30: qty 1

## 2021-06-30 MED ORDER — LIDOCAINE HCL (PF) 1 % IJ SOLN
INTRAMUSCULAR | Status: AC
  Filled 2021-06-30: qty 30

## 2021-06-30 MED ORDER — MIDAZOLAM HCL 2 MG/2ML IJ SOLN
INTRAMUSCULAR | Status: DC | PRN
  Administered 2021-06-30 (×2): 1 mg via INTRAVENOUS

## 2021-06-30 MED ORDER — FENTANYL CITRATE (PF) 100 MCG/2ML IJ SOLN
INTRAMUSCULAR | Status: AC
  Filled 2021-06-30: qty 2

## 2021-06-30 MED ORDER — SODIUM CHLORIDE 0.9 % IV SOLN: INTRAVENOUS | Status: DC

## 2021-06-30 NOTE — H&P (Signed)
Chief Complaint: Patient was seen in consultation today for right parotid nodule biopsy  at the request of Teoh,Su  Referring Physician(s): Teoh,Su  Supervising Physician: Corrie Mckusick  Patient Status: [facility] - Out-pt  History of Present Illness: Thomas Richard is a 50 y.o. male with PMH of anxiety, DM type II, HLD, HTN, hypothyroidism, insomnia, tobacco abuse.  Patient saw PCP complaining of headaches and was sent for MRI brain on 05/09/21 with an incidental finding of a soft tissue focus on the right parotid gland. CT soft tissue neck on 06/04/2021 reflected same soft tissue focus concerning for primary parotid neoplasm with direct tissue sampling suggested. Patient's headaches were determined to be result of teeth grinding and have been resolved.  Patient reports that his father had a benign growth in his right parotid gland that was removed.  Dr. Benjamine Mola has sent patient today for [place] FNA biopsy of right parotid gland. Dr. Vernard Gambles, IR, approved biopsy.  CT soft tissue neck 06/04/2021:  IMPRESSION: 2.1 x 1.4 cm ovoid soft tissue focus within/along the inferior aspect of the right parotid gland. 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 Alternatively, this could reflect a pathologically enlarged intraparotid/periparotid lymph node. ENT consultation and direct tissue sampling should be considered.   Elsewhere within the neck, there is no cervical lymphadenopathy.   Cervical spondylosis, greatest at C5-C6.   Calcified plaque within the carotid bifurcations bilaterally.    Past Medical History:  Diagnosis Date   Anxiety    Diabetes mellitus without complication ([place])    ED (erectile dysfunction)    Hyperlipidemia    Hypertension    Hypothyroid    Insomnia    Prostatitis    Smoker     Past Surgical History:  Procedure Laterality Date   WISDOM TOOTH EXTRACTION      Allergies: Patient has no known allergies.  Medications: Prior to Admission medications    Medication Sig Start Date End Date Taking? Authorizing Provider  ALPRAZolam Duanne Moron) 1 MG tablet Take 1 mg by mouth at bedtime as needed for anxiety.   Yes [provider]  amLODipine (NORVASC) 10 MG tablet Take 10 mg by mouth daily.   Yes [provider]  aspirin 81 MG tablet Take 81 mg by mouth daily.   Yes [provider]  atorvastatin (LIPITOR) 40 MG tablet Take 40 mg by mouth daily.   Yes [provider]  Continuous Blood Gluc Sensor (FREESTYLE LIBRE 14 DAY SENSOR) MISC SMARTSIG:1 Each Topical Every 2 Weeks 03/13/21  Yes [provider]  escitalopram (LEXAPRO) 10 MG tablet Take 10 mg by mouth daily.   Yes [provider]  insulin lispro (HUMALOG) 100 UNIT/ML injection Inject into the skin See admin instructions. Via Insulin Pump   Yes [provider]  levothyroxine (SYNTHROID, LEVOTHROID) 75 MCG tablet Take 75-150 mcg by mouth daily before breakfast. 150 mcg on Sat, and 75 mcg all other days   Yes [provider]  loratadine (CLARITIN) 10 MG tablet Take 10 mg by mouth daily.   Yes [provider]  Multiple Vitamin (MULTIVITAMIN) tablet Take 1 tablet by mouth daily.   Yes [provider]  Multiple Vitamins-Minerals (MULTIVITAMIN WITH MINERALS) tablet Take 1 tablet by mouth daily.   Yes [provider]  olmesartan (BENICAR) 40 MG tablet Take 40 mg by mouth daily. 01/18/20  Yes [provider]  spironolactone (ALDACTONE) 25 MG tablet Take 25 mg by mouth daily. 11/14/19  Yes [provider]  Family History  Problem Relation Age of Onset   Depression Mother    Lung disease Father    Diabetes Father    Hypertension Father     Social History   Socioeconomic History   Marital status: Single    Spouse name: Not on file   Number of children: Not on file   Years of education: Not on file   Highest education level: Not on file  Occupational History   Not on file  Tobacco  Use   Smoking status: Every Day    Packs/day: 1.00    Years: 10.00    Pack years: 10.00    Types: Cigarettes   Smokeless tobacco: Never  Substance and Sexual Activity   Alcohol use: Yes    Alcohol/week: 0.0 standard drinks    Comment: 2 beers per dasy   Drug use: No   Sexual activity: Not on file  Other Topics Concern   Not on file  Social History Narrative   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   Social Determinants of Health   Financial Resource Strain: Not on file  Food Insecurity: Not on file  Transportation Needs: Not on file  Physical Activity: Not on file  Stress: Not on file  Social Connections: Not on file     Review of Systems: A 12 point ROS discussed and pertinent positives are indicated in the HPI above.  All other systems are negative.  Review of Systems  Constitutional:  Negative for chills and fever.  Respiratory:  Negative for cough and shortness of breath.   Cardiovascular:  Negative for chest pain and leg swelling.  Gastrointestinal:  Negative for abdominal pain, blood in stool, nausea and vomiting.   Vital Signs: BP (P) 127/87 (BP Location: Right Arm)   Pulse (P) 90   Ht (P) 6' (1.829 m)   Wt (P) 210 lb (95.3 kg)   SpO2 (P) 97%   BMI (P) 28.48 kg/m   Physical Exam Vitals reviewed.  Constitutional:      Appearance: Normal appearance. He is not ill-appearing.  HENT:     Head: Normocephalic and atraumatic.     Mouth/Throat:     Mouth: Mucous membranes are moist.     Pharynx: Oropharynx is clear.  Cardiovascular:     Rate and Rhythm: Normal rate and regular rhythm.     Pulses: Normal pulses.     Heart sounds: Normal heart sounds. No murmur heard.   No gallop.  Pulmonary:     Effort: Pulmonary effort is normal. No respiratory distress.     Breath sounds: Normal breath sounds. No stridor. No wheezing, rhonchi or rales.  Abdominal:     General: There is no distension.     Palpations: Abdomen is  soft.     Tenderness: There is no abdominal tenderness. There is no guarding.  Musculoskeletal:     Right lower leg: No edema.     Left lower leg: No edema.  Skin:    General: Skin is warm and dry.  Neurological:     Mental Status: He is alert and oriented to person, place, and time. Mental status is at baseline.  Psychiatric:        Mood and Affect: Mood normal.        Behavior: Behavior normal.        Thought Content: Thought content normal.        Judgment: Judgment normal.  Imaging: CT SOFT TISSUE NECK W CONTRAST  Result Date: 06/05/2021 CLINICAL DATA:  Mass of right parotid gland. Additional history provided by scanning technologist: Patient reports facial pain. EXAM: CT NECK WITH CONTRAST TECHNIQUE: Multidetector CT imaging of the neck was performed using the standard protocol following the bolus administration of intravenous contrast. CONTRAST:  25mL ISOVUE-300 IOPAMIDOL (ISOVUE-300) INJECTION 61% COMPARISON:  Brain MRI 05/09/2021. FINDINGS: Pharynx and larynx: No appreciable swelling or discrete mass within the oral cavity, pharynx or larynx. Salivary glands: 2.1 x 1.4 cm ovoid soft tissue focus within/along the inferior aspect of the right parotid gland (for instance as seen on series 6, image 18) (series 3, image 45). The left parotid and bilateral submandibular glands are unremarkable. Thyroid: Unremarkable. Lymph nodes: 2.1 x 1.4 cm ovoid soft tissue focus within/along the inferior aspect of the right parotid gland. Elsewhere within the neck, no pathologically enlarged cervical chain lymph nodes are identified. Vascular: The major vascular structures of the neck are patent. Calcified atherosclerotic plaque within the carotid bifurcations bilaterally. Limited intracranial: No evidence of acute intracranial mallet within the field of view. Visualized orbits: Largely excluded from the field of view. No mass or acute finding at the imaged levels. Mastoids and visualized paranasal sinuses:  Trace bilateral ethmoid sinus mucosal thickening at the imaged levels. No significant mastoid effusion. Skeleton: Cervical spondylosis. Most notably at C5-C6, there is moderate disc degeneration, 2 mm grade 1 retrolisthesis, disc bulge and bilateral uncovertebral hypertrophy. Left bony neural foraminal narrowing and at least mild spinal canal stenosis at this level. Upper chest: No consolidation within the imaged lung apices. IMPRESSION: 2.1 x 1.4 cm ovoid soft tissue focus within/along the inferior aspect of the right parotid gland. 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 Alternatively, this could reflect a pathologically enlarged intraparotid/periparotid lymph node. ENT consultation and direct tissue sampling should be considered. Elsewhere within the neck, there is no cervical lymphadenopathy. Cervical spondylosis, greatest at C5-C6. Calcified plaque within the carotid bifurcations bilaterally. Electronically Signed   By: Kellie Simmering D.O.   On: 06/05/2021 13:49    Labs:  CBC: No results for input(s): WBC, HGB, HCT, PLT in the last 8760 hours.  COAGS: No results for input(s): INR, APTT in the last 8760 hours.  BMP: No results for input(s): NA, K, CL, CO2, GLUCOSE, BUN, CALCIUM, CREATININE, GFRNONAA, GFRAA in the last 8760 hours.  Invalid input(s): CMP  LIVER FUNCTION TESTS: No results for input(s): BILITOT, AST, ALT, ALKPHOS, PROT, ALBUMIN in the last 8760 hours.  TUMOR MARKERS: No results for input(s): AFPTM, CEA, CA199, CHROMGRNA in the last 8760 hours.  Assessment and Plan:  History of anxiety, DM type II, HLD, HTN, hypothyroidism, insomnia, tobacco abuse.  Patient saw PCP complaining of headaches and was sent for MRI brain on 05/09/21 with an incidental finding of a soft tissue focus on the right parotid gland. CT soft tissue neck on 06/04/2021 reflected same soft tissue focus concerning for primary parotid neoplasm with direct tissue sampling suggested. Patient's headaches  were determined to be result of teeth grinding and have been resolved.  Patient reports that his father had a benign growth in his right parotid gland that was removed.  Dr. Benjamine Mola has sent patient today for [place] FNA biopsy of right parotid gland. Dr. Vernard Gambles, IR, approved biopsy.  Pt sitting upright on edge of bed and walking around room. He is A&O, calm and pleasant.  He states that he drank coffee around 6:45 am today but nothing after.  Pt does not take thinners.  Ordered labs pending. Pt is in NAD.   Risks and benefits of was discussed with the patient and/or patient's family including, but not limited to bleeding, infection, damage to adjacent structures or low yield requiring additional tests.  All of the questions were answered and there is agreement to proceed.  Consent signed and in chart.    Thank you for this interesting consult.  I greatly enjoyed meeting Rooney Swails and look forward to participating in their care.  A copy of this report was sent to the requesting provider on this date.  Electronically Signed: Tyson Alias, NP 06/30/2021, 11:38 AM   I spent a total of 30 minutes in face to face in clinical consultation, greater than 50% of which was counseling/coordinating care for right parotid nodule biopsy.

## 2021-06-30 NOTE — Sedation Documentation (Signed)
Pt transported to Short stay room 9 via bed accompanied by RN. Jo at bedside to receive pt. Handoff completed. Pt awake alert and oriented. No s/s of distress at this time.

## 2021-06-30 NOTE — Procedures (Signed)
Interventional Radiology Procedure Note  Procedure: US guided biopsy of right parotid nodule.   6 x 18g core biopsy into saline.   Complications: None EBL: None  Recommendations: - Bedrest 1 hours.   - Routine wound care - Follow up pathology - Advance diet   Signed,  Corrie Mckusick, DO

## 2021-07-01 LAB — SURGICAL PATHOLOGY

## 2021-07-08 ENCOUNTER — Other Ambulatory Visit: Payer: Self-pay

## 2021-07-08 ENCOUNTER — Encounter: Payer: Self-pay | Admitting: Orthopaedic Surgery

## 2021-07-08 ENCOUNTER — Ambulatory Visit: Payer: BC Managed Care – PPO | Admitting: Orthopaedic Surgery

## 2021-07-08 DIAGNOSIS — G8929 Other chronic pain: Secondary | ICD-10-CM | POA: Diagnosis not present

## 2021-07-08 DIAGNOSIS — M25562 Pain in left knee: Secondary | ICD-10-CM | POA: Diagnosis not present

## 2021-07-08 DIAGNOSIS — M7711 Lateral epicondylitis, right elbow: Secondary | ICD-10-CM | POA: Diagnosis not present

## 2021-07-08 MED ORDER — METHYLPREDNISOLONE ACETATE 40 MG/ML IJ SUSP
40.0000 mg | INTRAMUSCULAR | Status: AC | PRN
Start: 2021-07-08 — End: 2021-07-08
  Administered 2021-07-08: 40 mg via INTRA_ARTICULAR

## 2021-07-08 MED ORDER — BUPIVACAINE HCL 0.25 % IJ SOLN
2.0000 mL | INTRAMUSCULAR | Status: AC | PRN
Start: 2021-07-08 — End: 2021-07-08
  Administered 2021-07-08: 2 mL via INTRA_ARTICULAR

## 2021-07-08 MED ORDER — LIDOCAINE HCL 1 % IJ SOLN
2.0000 mL | INTRAMUSCULAR | Status: AC | PRN
  Administered 2021-07-08: 2 mL

## 2021-07-08 NOTE — Progress Notes (Signed)
Office Visit Note   Patient: Thomas Richard           Date of Birth: 01/19/1971           MRN: [id number] Visit Date: 07/08/2021              Requested by: Shon Baton, [address] [place],  [place] [id number] PCP: Shon Baton, MD   Assessment & Plan: Visit Diagnoses:  1. Chronic pain of left knee   2. Lateral epicondylitis, right elbow     Plan: Impression is right elbow lateral epicondylitis and left knee lateral meniscus tear.  In regards to the elbow, he has not had longstanding relief from previous cortisone injection, extensor tendon stretches/tennis elbow strap, and therefore I would like to order an MRI to assess for structural abnormalities.  In regards to the left knee, he has had continued pain and swelling despite cortisone injection.  I would like to get an MRI of the left knee as well.  He is interested in getting a cortisone injection today, so we have discussed proceeding with this but knowing that if something is found on the MRI that require surgery, he will need to wait 6 to 8 weeks.  He is okay with this.  He will follow-up with [place] once the MRIs been completed.  Call with concerns or questions in the meantime.  Follow-Up Instructions: Return for f/u after MRI.   Orders:  Orders Placed This Encounter  Procedures   Large Joint Inj: L knee   MR Elbow Right w/o contrast   MR Knee Left w/o contrast   No orders of the defined types were placed in this encounter.     Procedures: Large Joint Inj: L knee on 07/08/2021 9:07 AM Indications: pain Details: 22 G needle, anterolateral approach Medications: 2 mL lidocaine 1 %; 2 mL bupivacaine 0.25 %; 40 mg methylPREDNISolone acetate 40 MG/ML     Clinical Data: No additional findings.   Subjective: Chief Complaint  Patient presents with   Left Knee - Follow-up   Right Elbow - Pain    HPI patient is a pleasant 50 year old gentleman who comes in today with recurrent left knee and right elbow pain.  In regards  to the left knee, this has been ongoing for over a year.  He was seen in our office last December where cortisone injection was performed.  This did help but only lasted for few months.  He is here with the same symptoms.  The pain he has is to the lateral aspect.  Worse with flexion of the knee.  He does have associated locking, swelling and giving way.  He is walking with a limp due to the pain.  In regards to the right elbow, he does have a history of lateral epicondylitis.  He was seen in our office over a year ago for this where cortisone injection was performed.  Extensor tendon exercises and a tennis elbow strap was provided.  He noticed relief for a few months following the injection but his symptoms have returned.  They are all to the lateral elbow and worse with gripping.  Review of Systems as detailed in HPI.  All others reviewed and are negative.   Objective: Vital Signs: There were no vitals taken for this visit.  Physical Exam well-developed well-nourished gentleman in no acute distress.  Alert and oriented x3.  Ortho Exam right elbow exam shows moderate tenderness to the lateral epicondyle.  He does have increased pain with  gripping and resisted extension of the long finger.  Left knee exam shows a small effusion.  Range of motion 30 to 95 degrees.  Mild lateral joint line tenderness.  Ligaments are stable.  He is neurovascular intact distally.  Specialty Comments:  No specialty comments available.  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   Chronic left-sided headaches 02/07/2020   Cigarette smoker 02/07/2020   Insulin pump status 02/07/2020   Chronic pain of left knee 02/20/2019   Past Medical History:  Diagnosis Date   Anxiety    Diabetes mellitus without complication ([place])    ED (erectile dysfunction)    Hyperlipidemia     Hypertension    Hypothyroid    Insomnia    Prostatitis    Smoker     Family History  Problem Relation Age of Onset   Depression Mother    Lung disease Father    Diabetes Father    Hypertension Father     Past Surgical History:  Procedure Laterality Date   WISDOM TOOTH EXTRACTION     Social History   Occupational History   Not on file  Tobacco Use   Smoking status: Every Day    Packs/day: 1.00    Years: 10.00    Pack years: 10.00    Types: Cigarettes   Smokeless tobacco: Never  Substance and Sexual Activity   Alcohol use: Yes    Alcohol/week: 0.0 standard drinks    Comment: 2 beers per dasy   Drug use: No   Sexual activity: Not on file

## 2021-07-23 ENCOUNTER — Other Ambulatory Visit: Payer: Self-pay

## 2021-07-23 ENCOUNTER — Ambulatory Visit
Admission: RE | Admit: 2021-07-23 | Discharge: 2021-07-23 | Disposition: A | Discharge status: Home / Self Care | Payer: BC Managed Care – PPO | Source: Ambulatory Visit | Attending: Orthopaedic Surgery | Admitting: Orthopaedic Surgery

## 2021-07-23 DIAGNOSIS — M25562 Pain in left knee: Secondary | ICD-10-CM

## 2021-07-23 DIAGNOSIS — M7711 Lateral epicondylitis, right elbow: Secondary | ICD-10-CM

## 2021-07-23 DIAGNOSIS — G8929 Other chronic pain: Secondary | ICD-10-CM

## 2021-07-23 IMAGING — MR MR KNEE*L* W/O CM
4 of 6 series · 22 of 40 positions shown · non-contrast
Comparison: Radiographs [DATE].  MRI [DATE].

CLINICAL DATA: Lateral knee pain with intermittent locking. Recent
cortisone injection with resolution of pain. History of lateral
meniscal tear.

EXAM:
MRI OF THE LEFT KNEE WITHOUT CONTRAST
TECHNIQUE: Multiplanar, multisequence MR imaging of the knee was performed. No
intravenous contrast was administered.

[Series 3: T2 fat-sat · axial · 4.0mm · 0.50mm/px · z∈[-51,+44]mm · 5 of 24 slices shown (1 of 2)]
[im 1/24]
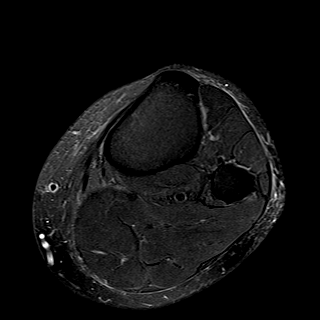
[im 4/24]
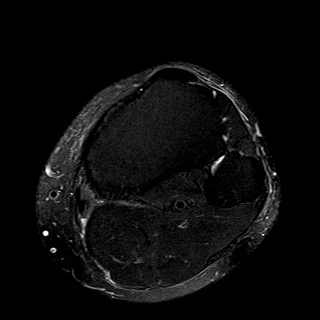
[im 8/24]
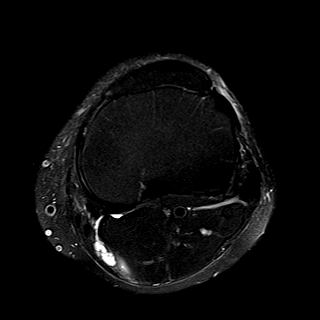
[im 12/24]
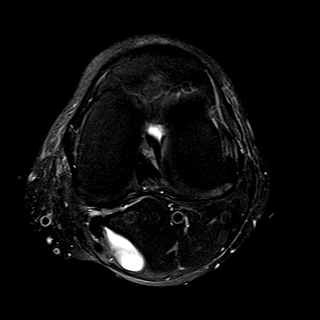
[im 20/24]
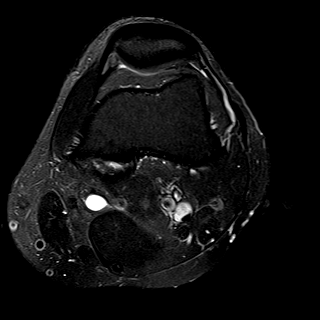

[Series 4: T2 fat-sat · sagittal · 3.0mm · 0.29mm/px · 3 of 27 slices shown (2 of 2)]
[im 5/27]
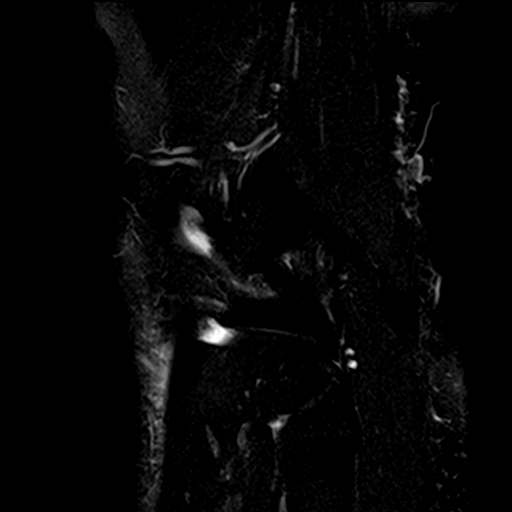
[im 14/27]
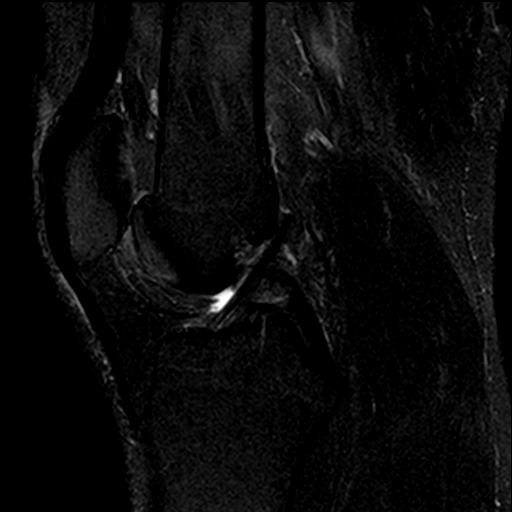
[im 22/27]
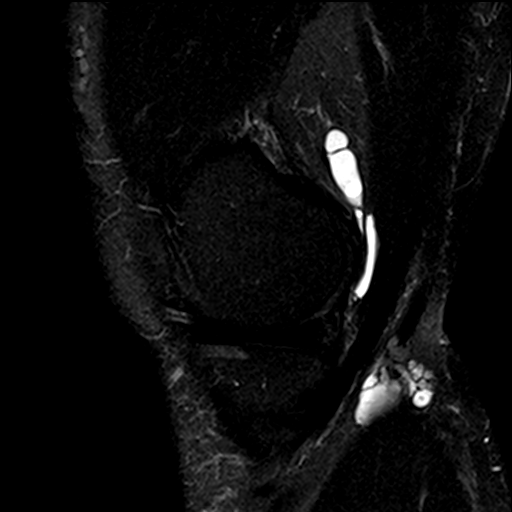

[Series 7: PD fat-sat · sagittal · 3.0mm · 0.29mm/px · 7 of 27 slices shown (1 of 2)]
[im 1/27]
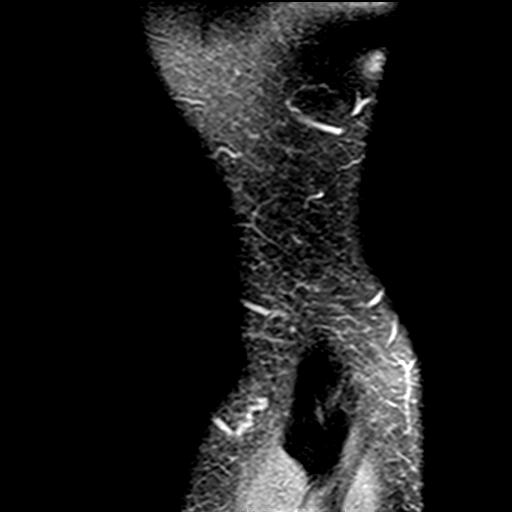
[im 5/27]
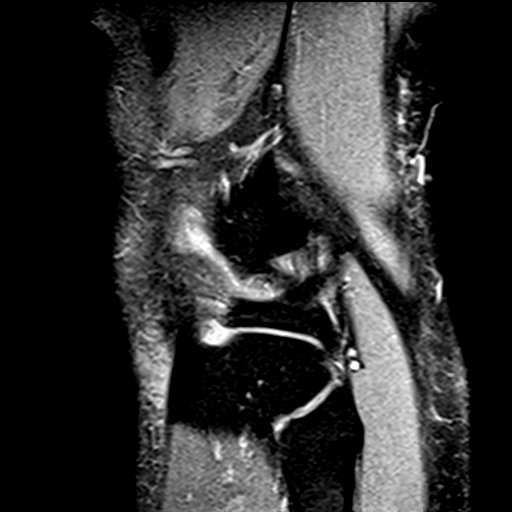
[im 9/27]
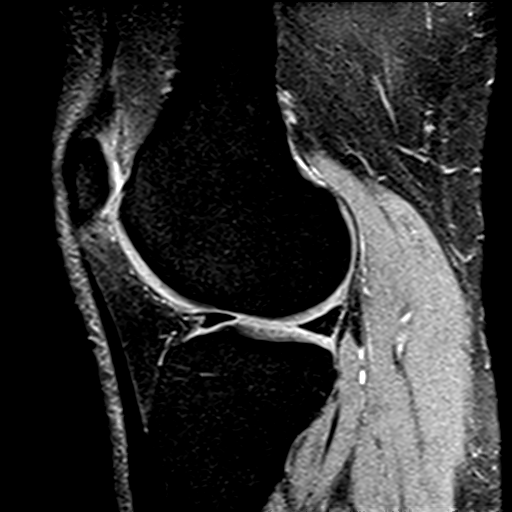
[im 14/27]
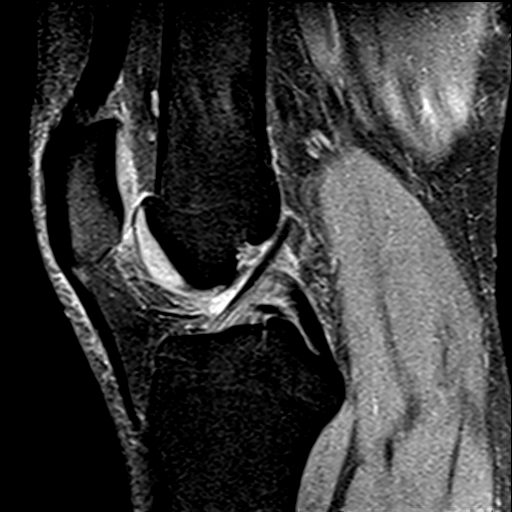
[im 18/27]
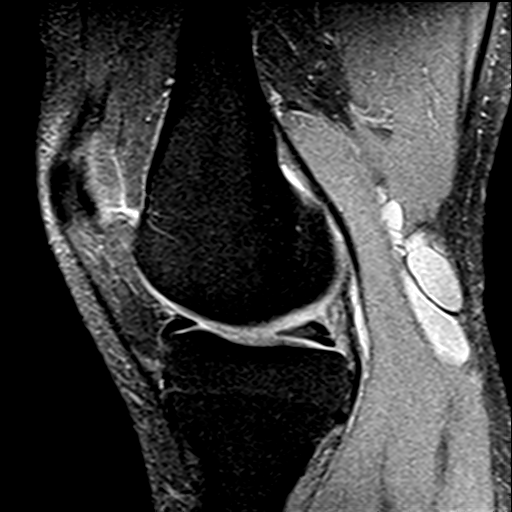
[im 22/27]
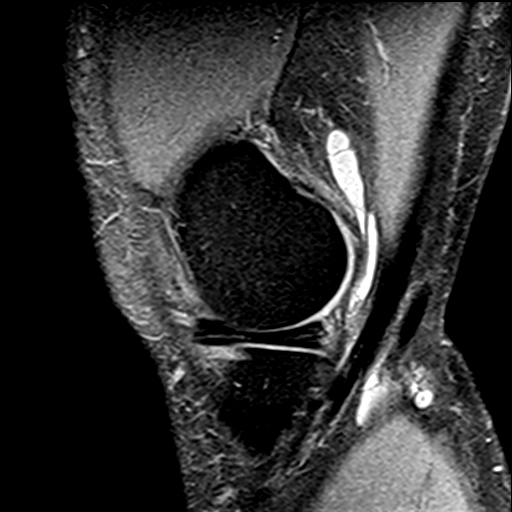
[im 27/27]
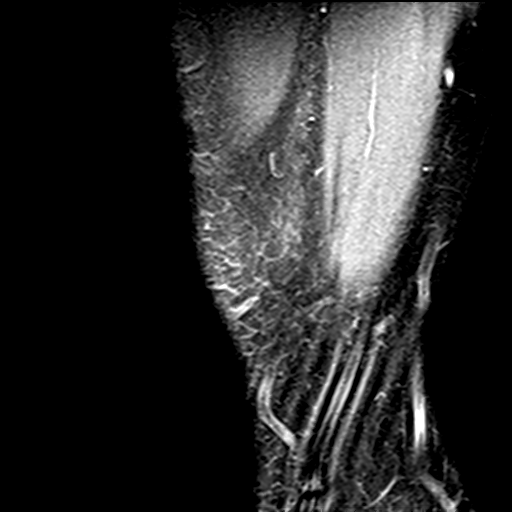

[Series 8: PD fat-sat · coronal · 3.0mm · 0.29mm/px · 7 of 28 slices shown (2 of 2)]
[im 1/28]
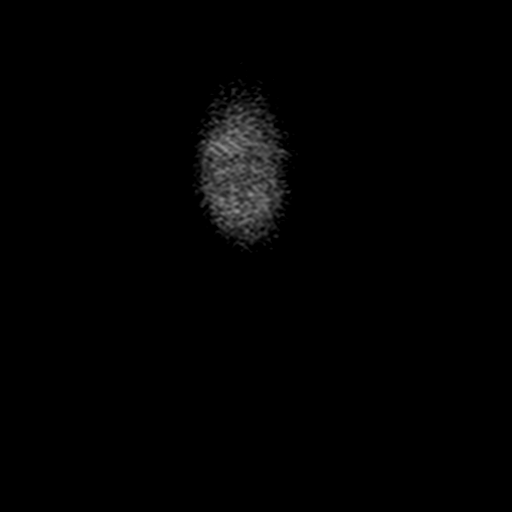
[im 5/28]
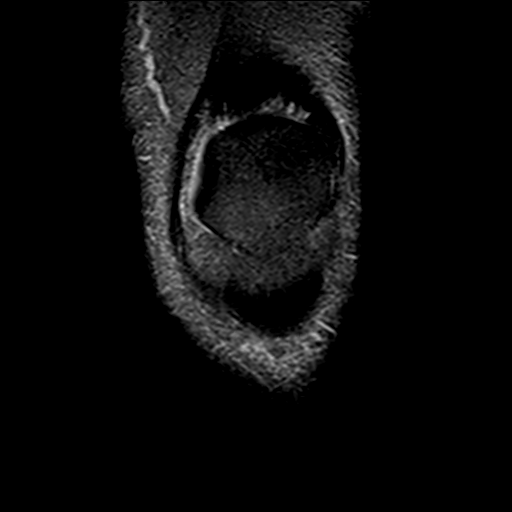
[im 10/28]
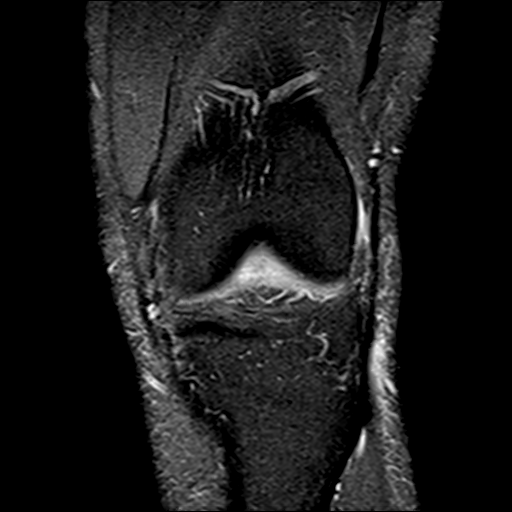
[im 14/28]
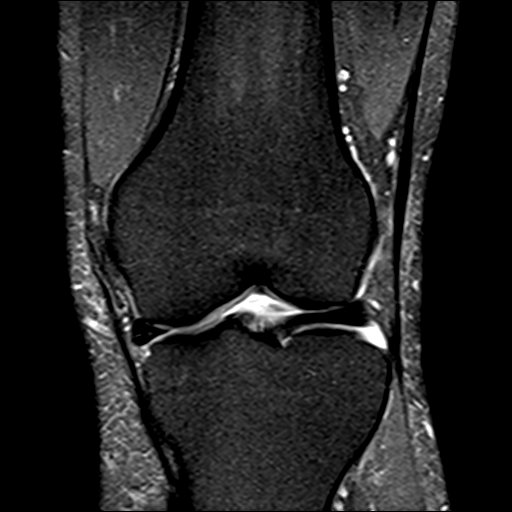
[im 19/28]
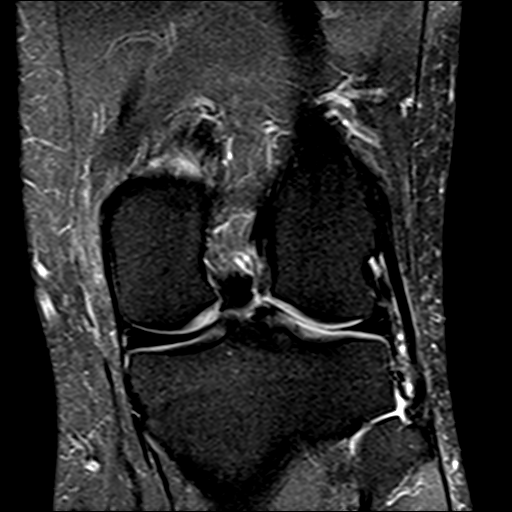
[im 23/28]
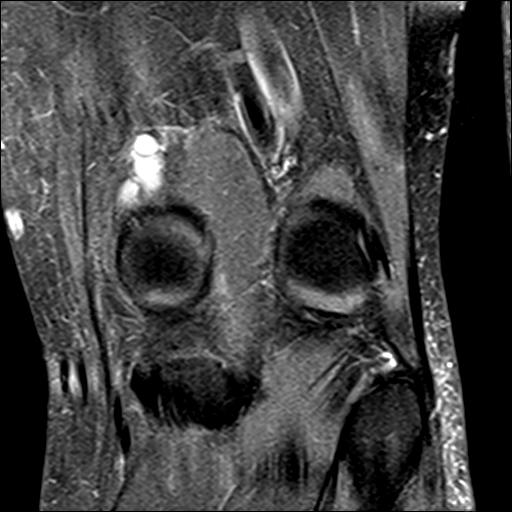
[im 28/28]
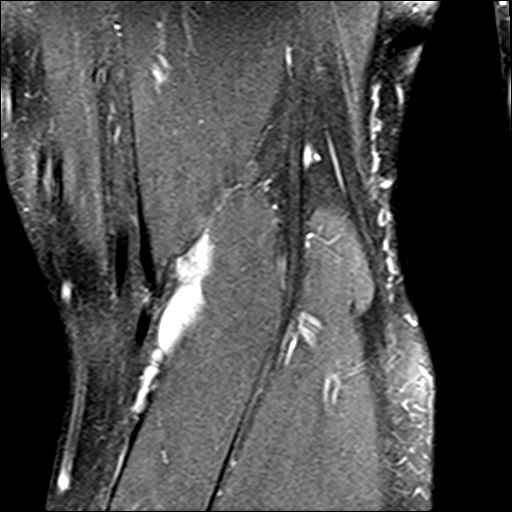

[22 of 40 positions shown; findings below may reference images not displayed]

FINDINGS: MENISCI

Medial meniscus:  Intact with normal morphology.

Lateral meniscus:  Intact with normal morphology.

LIGAMENTS

Cruciates:  Intact.

Collaterals:  Intact.

CARTILAGE

Patellofemoral:  Preserved.

Medial:  Preserved.

Lateral:  Preserved.

MISCELLANEOUS

Joint:  No significant joint effusion.

Popliteal Fossa: Previously demonstrated septated Baker's cyst has
mildly enlarged, measuring up to 2.6 cm transverse and extending
approximately 8.7 cm in length. The popliteus muscle and tendon
appear normal.

Extensor Mechanism:  Intact.

Bones:  No acute or significant extra-articular osseous findings.

Other: No other significant periarticular soft tissue findings.
IMPRESSION: 1. No clear explanation for lateral knee pain or knee locking.
2. The menisci, cruciate and collateral ligaments are intact. No
acute osseous findings or significant arthropathic changes.
3. Septated Baker's cyst has mildly enlarged from [1S].

## 2021-07-23 IMAGING — MR MR ELBOW*R* W/O CM
4 of 5 series · 19 of 40 positions shown · non-contrast
Comparison: Radiographs [DATE]. No recent radiographs
available.

CLINICAL DATA: Lateral elbow pain.  Lateral epicondylitis.

EXAM:
MRI OF THE RIGHT ELBOW WITHOUT CONTRAST
TECHNIQUE: Multiplanar, multisequence MR imaging of the elbow was performed. No
intravenous contrast was administered.

[Series 6: T1 · axial · 3.5mm · 0.27mm/px · z∈[-28,+57]mm · 3 of 25 slices shown]
[im 4/25]
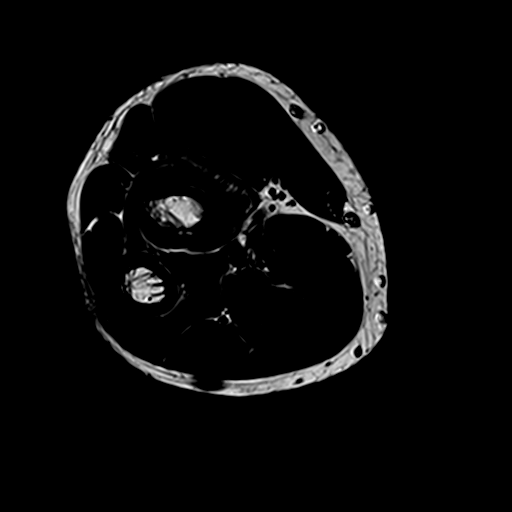
[im 13/25]
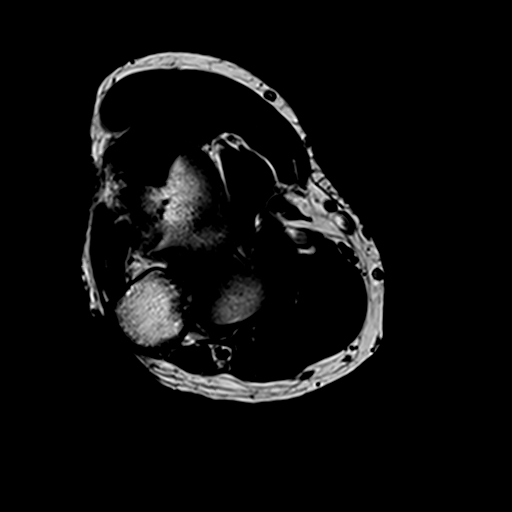
[im 22/25]
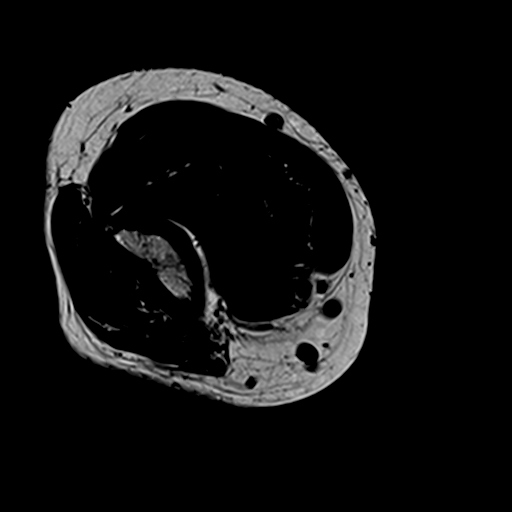

[Series 7: T2 fat-sat · axial · 3.5mm · 0.27mm/px · z∈[-43,+71]mm · 9 of 25 slices shown (1 of 3)]
[im 1/25]
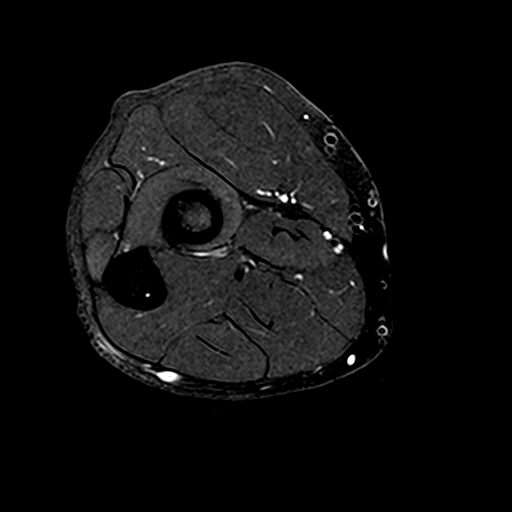
[im 4/25]
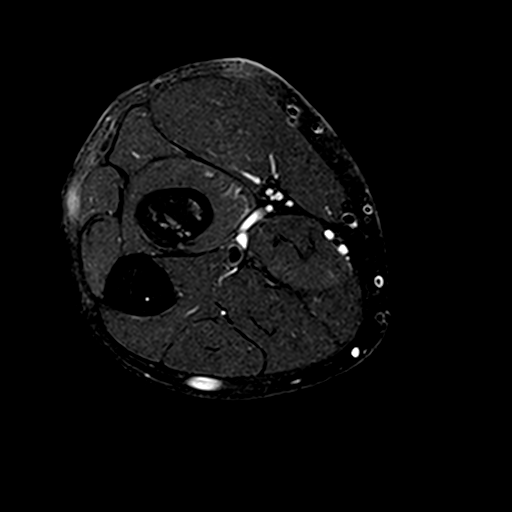
[im 7/25]
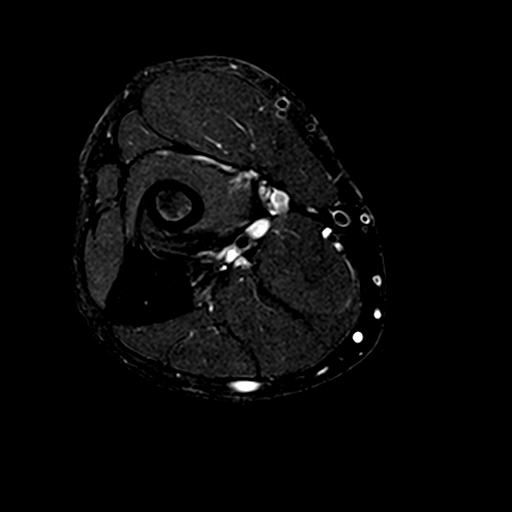
[im 10/25]
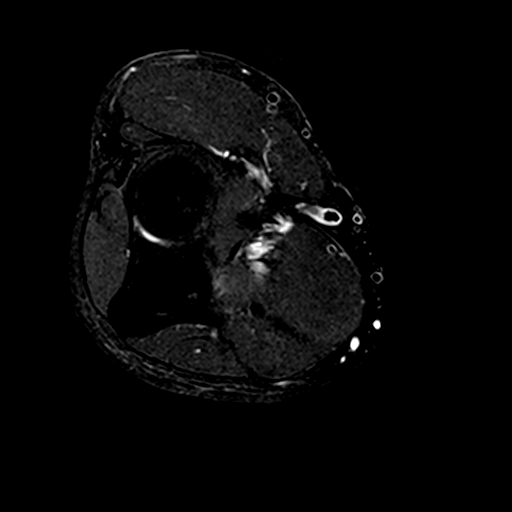
[im 13/25]
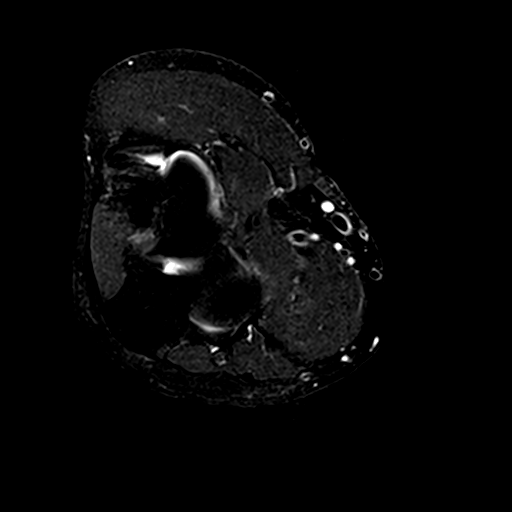
[im 16/25]
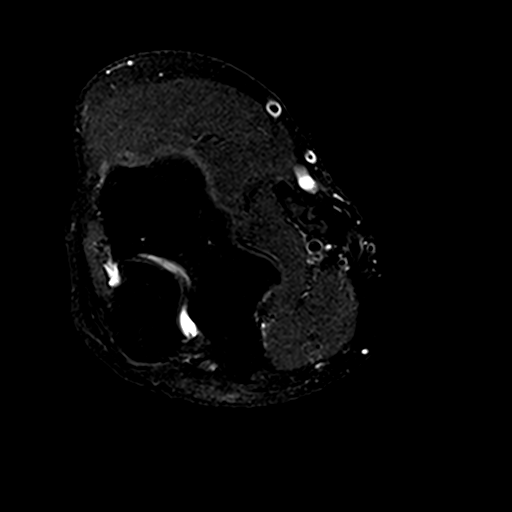
[im 19/25]
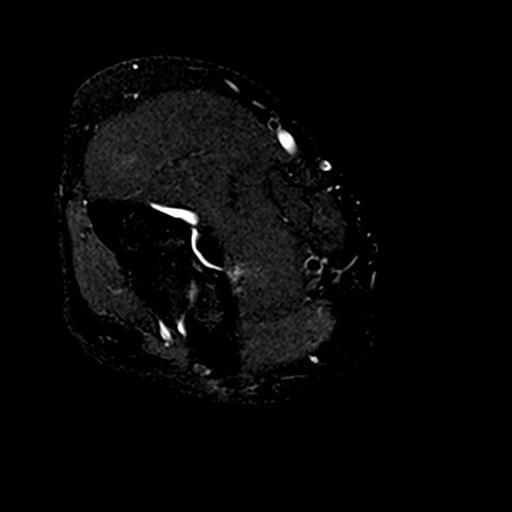
[im 22/25]
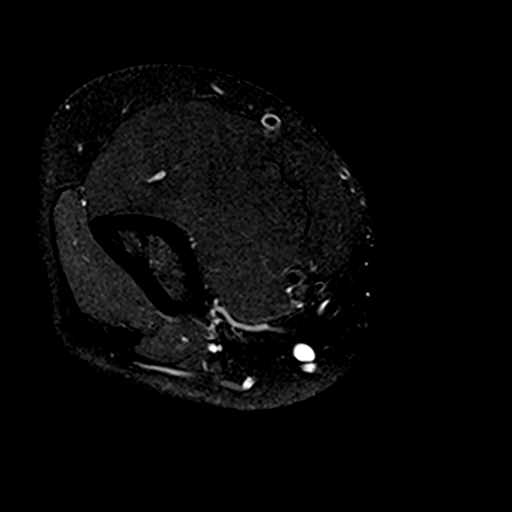
[im 25/25]
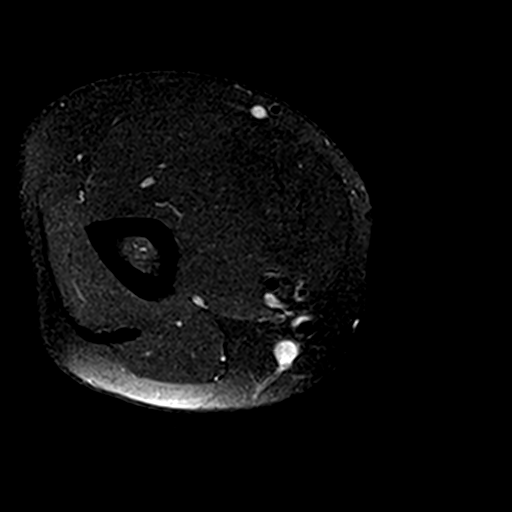

[Series 9: T2 fat-sat · sagittal · 3.5mm · 0.27mm/px · 4 of 22 slices shown (2 of 3)]
[im 1/22]
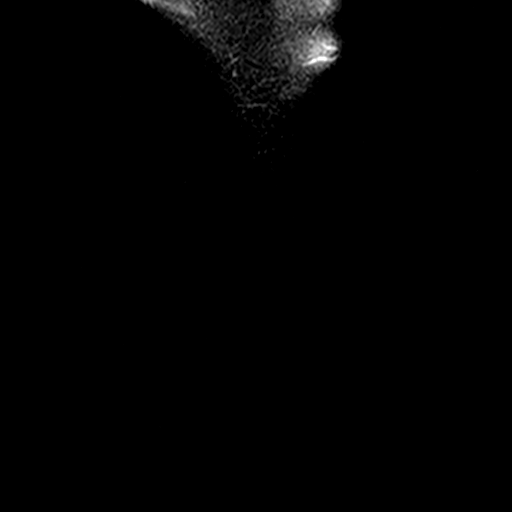
[im 4/22]
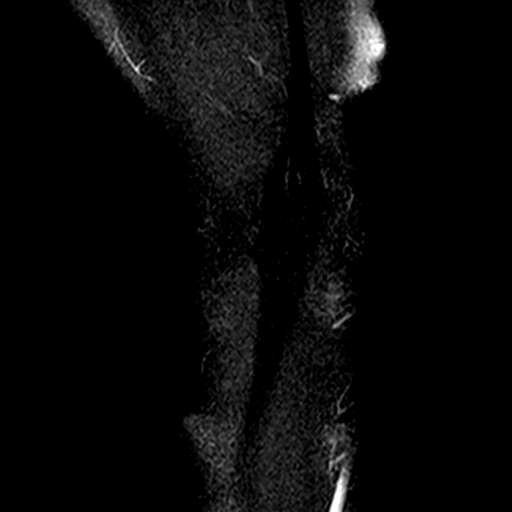
[im 11/22]
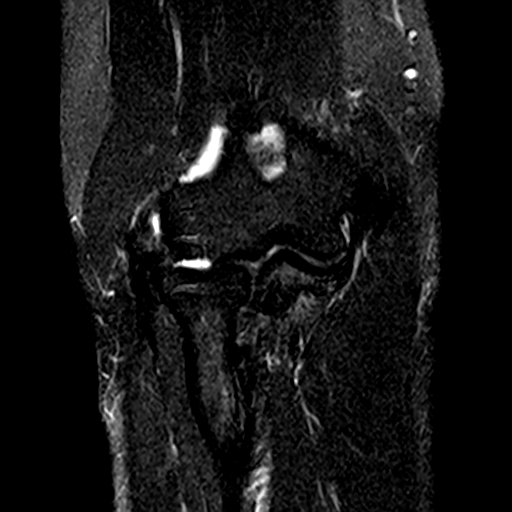
[im 18/22]
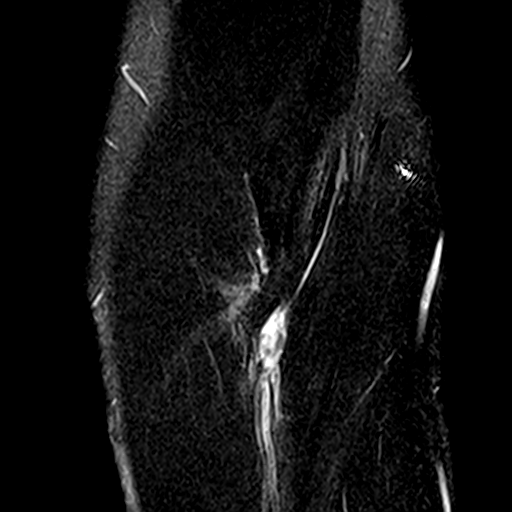

[Series 10: T2 fat-sat · coronal · 3.5mm · 0.31mm/px · 3 of 25 slices shown (3 of 3)]
[im 4/25]
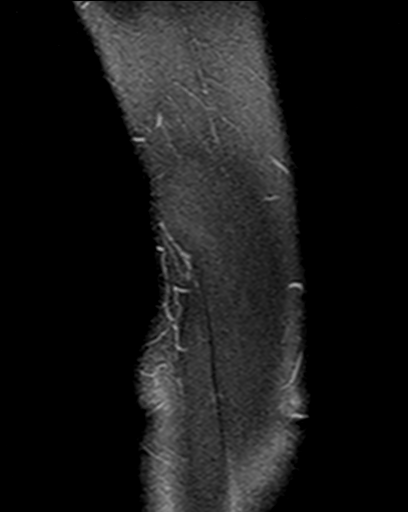
[im 14/25]
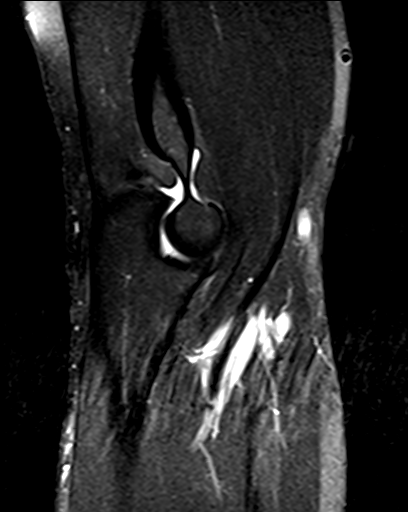
[im 21/25]
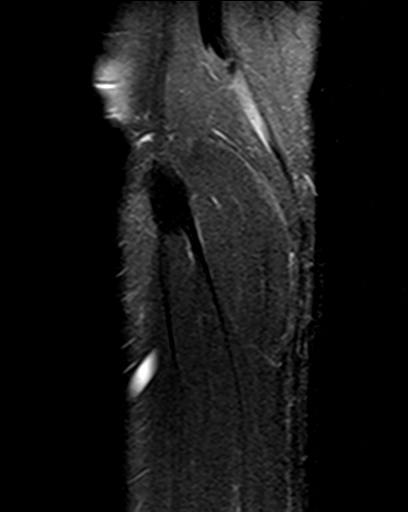

[19 of 40 positions shown; findings below may reference images not displayed]

FINDINGS: TENDONS

Common forearm flexor origin: Intact with normal signal.

Common forearm extensor origin: Marked tendinosis with partial
tendon tearing at the humeral attachment.

Biceps: Intact.

Triceps: Intact with normal signal.

LIGAMENTS

Medial stabilizers: Intact.

Lateral stabilizers: The humeral attachment of the radial collateral
ligament is not well visualized and may be torn. The lateral ulnar
collateral ligament appears intact.

Cartilage: Preserved. No focal chondral defect demonstrated. Mild
spurring in the ulnohumeral compartment.

Joint: Small joint effusion.  No loose body observed.

Cubital tunnel: Unremarkable.  The ulnar nerve appears normal.

Bones: No acute or significant extra-articular osseous findings.

Other: None.
IMPRESSION: 1. Prominent tendinosis and partial tearing of the common extensor
tendon. No focal muscular atrophy or edema.
2. The humeral attachment of the radial collateral ligament is not
well visualized and may be torn.
3. Mild ulnohumeral compartment spurring without acute osseous
findings or intra-articular loose body.
4. The additional elbow tendons and ligaments appear intact.

## 2021-07-28 ENCOUNTER — Encounter: Payer: Self-pay | Admitting: Gastroenterology

## 2021-07-28 ENCOUNTER — Ambulatory Visit: Payer: BC Managed Care – PPO | Admitting: Orthopaedic Surgery

## 2021-07-28 ENCOUNTER — Other Ambulatory Visit: Payer: Self-pay

## 2021-07-28 DIAGNOSIS — M25562 Pain in left knee: Secondary | ICD-10-CM | POA: Diagnosis not present

## 2021-07-28 DIAGNOSIS — M7711 Lateral epicondylitis, right elbow: Secondary | ICD-10-CM

## 2021-07-28 DIAGNOSIS — G8929 Other chronic pain: Secondary | ICD-10-CM | POA: Diagnosis not present

## 2021-07-28 NOTE — Progress Notes (Signed)
Office Visit Note   Patient: Thomas Richard           Date of Birth: 06-23-1971           MRN: [id number] Visit Date: 07/28/2021              Requested by: Shon Baton, [address] [place],  [place] [id number] PCP: Shon Baton, MD   Assessment & Plan: Visit Diagnoses:  1. Lateral epicondylitis, right elbow   2. Chronic pain of left knee     Plan: The left knee he is much better after the cortisone injection.  After further discussion it sounds like he had an MRI back in 2015 which showed a peripheral tear of the lateral meniscus which ultimately healed on later MRI scans.  He states that it only happens occasionally when he does lunges therefore I think that I the lateral meniscus may be getting pinched between the lateral femoral condyle and lateral tibial plateau.  I reassured him that the MRI does not show any structural problems but the lunges may cause a lateral meniscus tear in the future so I recommended that he avoid this maneuver.  In terms of the elbow the counterforce brace is very effective in alleviating pain.  He will use Voltaren gel for this as well.  I have sent a prescription to physical therapy for dry needling and home exercises.  We will see him back as needed.  Follow-Up Instructions: Return if symptoms worsen or fail to improve.   Orders:  Orders Placed This Encounter  Procedures   Ambulatory referral to Physical Therapy   No orders of the defined types were placed in this encounter.     Procedures: No procedures performed   Clinical Data: No additional findings.   Subjective: Chief Complaint  Patient presents with   Other     Scan review    HPI  Patient returns today for MRI review of the left knee and right elbow.  Review of Systems   Objective: Vital Signs: There were no vitals taken for this visit.  Physical Exam  Ortho Exam  Exams are unchanged.  Specialty Comments:  No specialty comments available.  Imaging: No results  found.   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   Chronic left-sided headaches 02/07/2020   Cigarette smoker 02/07/2020   Insulin pump status 02/07/2020   Chronic pain of left knee 02/20/2019   Past Medical History:  Diagnosis Date   Anxiety    Diabetes mellitus without complication ([place])    ED (erectile dysfunction)    Hyperlipidemia    Hypertension    Hypothyroid    Insomnia    Prostatitis    Smoker     Family History  Problem Relation Age of Onset   Depression Mother    Lung disease Father    Diabetes Father    Hypertension Father     Past Surgical History:  Procedure Laterality Date   WISDOM TOOTH EXTRACTION     Social History   Occupational History   Not on file  Tobacco Use   Smoking status: Every Day    Packs/day: 1.00    Years: 10.00    Pack years: 10.00    Types: Cigarettes   Smokeless tobacco: Never  Substance and Sexual Activity   Alcohol use: Yes    Alcohol/week: 0.0 standard drinks    Comment:  2 beers per dasy   Drug use: No   Sexual activity: Not on file

## 2021-08-07 ENCOUNTER — Other Ambulatory Visit: Payer: Self-pay

## 2021-08-07 ENCOUNTER — Ambulatory Visit (INDEPENDENT_AMBULATORY_CARE_PROVIDER_SITE_OTHER): Payer: BC Managed Care – PPO | Admitting: Physical Therapy

## 2021-08-07 ENCOUNTER — Encounter: Payer: Self-pay | Admitting: Physical Therapy

## 2021-08-07 DIAGNOSIS — G8929 Other chronic pain: Secondary | ICD-10-CM | POA: Diagnosis not present

## 2021-08-07 DIAGNOSIS — M25521 Pain in right elbow: Secondary | ICD-10-CM | POA: Diagnosis not present

## 2021-08-07 DIAGNOSIS — M6281 Muscle weakness (generalized): Secondary | ICD-10-CM

## 2021-08-07 DIAGNOSIS — M25562 Pain in left knee: Secondary | ICD-10-CM

## 2021-08-07 NOTE — Patient Instructions (Addendum)
Access Code: [email or web address] URL: [email or web address] Date: 08/07/2021 Prepared by: Faustino Congress  Exercises Standing Wrist Flexion Stretch - 2 x daily - 7 x weekly - 3 reps - 1 sets - 30 sec hold Towel Roll Squeeze - 3 x daily - 7 x weekly - 1 sets - 15 reps - 5 sec hold Seated Wrist Extension with Dumbbell - 1 x daily - 7 x weekly - 3 sets - 10 reps  Patient Education Trigger Point Dry Needling

## 2021-08-07 NOTE — Therapy (Signed)
[facility] [address] [place], [place], [address] Phone: [phone]   Fax:  [phone]  Physical Therapy Evaluation  Patient Details  Name: Thomas Richard MRN: [id number] Date of Birth: 10/16/1970 Referring Provider (PT): Leandrew Koyanagi, MD   Encounter Date: 08/07/2021   PT End of Session - 08/07/21 1100     Visit Number 1    Number of Visits 6    Date for PT Re-Evaluation 09/18/21    PT Start Time 1103    PT Stop Time 1138    PT Time Calculation (min) 35 min    Activity Tolerance Patient tolerated treatment well    Behavior During Therapy [facility] for tasks assessed/performed             Past Medical History:  Diagnosis Date   Anxiety    Diabetes mellitus without complication ([place])    ED (erectile dysfunction)    Hyperlipidemia    Hypertension    Hypothyroid    Insomnia    Prostatitis    Smoker     Past Surgical History:  Procedure Laterality Date   WISDOM TOOTH EXTRACTION      There were no vitals filed for this visit.    Subjective Assessment - 08/07/21 1058     Subjective Pt is a 50 y/o male who presents to OPPT for chronic Rt elbow and Lt knee pain.  He reports Lt knee had injection and at this time is not an issue.  The elbow pain has been ongoing for about 1 year without any known injury.  Strap seems to help pain.    Patient Stated Goals improve pain in elbow, return to working out    Currently in Pain? Yes    Pain Score 0-No pain   up to 4/10   Pain Location Elbow    Pain Orientation Right    Pain Descriptors / Indicators Aching;Dull;Discomfort;Nagging;Throbbing    Pain Type Chronic pain    Pain Onset More than a month ago    Pain Frequency Intermittent    Aggravating Factors  worse in AM, any use of Rt hand    Pain Relieving Factors rest, strap                [facility] PT Assessment - 08/07/21 1057       Assessment   Medical Diagnosis M77.11 (ICD-10-CM) - Lateral epicondylitis, right elbow   M25.562,G89.29 (ICD-10-CM) - Chronic pain of left knee    Referring Provider (PT) Leandrew Koyanagi, MD    Onset Date/Surgical Date --   chronic x 1 yr   Hand Dominance Right    Next MD Visit PRN    Prior Therapy n/a      Precautions   Precautions None      Restrictions   Weight Bearing Restrictions No      Balance Screen   Has the patient fallen in the past 6 months No    Has the patient had a decrease in activity level because of a fear of falling?  No    Is the patient reluctant to leave their home because of a fear of falling?  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    Leisure travel, [place] (  unable), weight lifting      Cognition   Overall Cognitive Status Within Functional Limits for tasks assessed      Observation/Other Assessments   Focus on Therapeutic Outcomes (FOTO)  70 (predicted 73)      ROM / Strength   AROM / PROM / Strength AROM;Strength      AROM   Overall AROM  Within functional limits for tasks performed      Strength   Strength Assessment Site Elbow;Forearm;Wrist;Hand    Right/Left Elbow Right    Right Elbow Flexion 5/5    Right Elbow Extension 5/5    Right/Left Forearm Right    Right Forearm Pronation 5/5   with pain   Right Forearm Supination 5/5    Right/Left Wrist Right    Right Wrist Flexion 5/5    Right Wrist Extension 4/5   with pain   Right/Left hand Right;Left    Right Hand Grip (lbs) 68.5   75.7 after DN   Left Hand Grip (lbs) 85.2      Palpation   Palpation comment trigger points along Rt wrist extensors                        Objective measurements completed on examination: See above findings.       [facility] Adult PT Treatment/Exercise - 08/07/21 1103       Exercises   Exercises Other  Exercises    Other Exercises  see pt instructions - performed PRN for understanding and education      Manual Therapy   Manual therapy comments STM with compression to Rt wrist extensors              Trigger Point Dry Needling - 08/07/21 1138     Consent Given? Yes    Education Handout Provided Yes    Muscles Treated Wrist/Hand Extensor digitorum;Extensor carpi ulnaris;Extensor carpi radialis longus/brevis    Extensor carpi radialis longus/brevis Response Twitch response elicited    Extensor digitorum Response Twitch response elicited    Extensor carpi ulnaris Response Twitch response elicited                   PT Education - 08/07/21 1059     Education Details HEP    Person(s) Educated Patient    Methods Explanation;Demonstration;Handout    Comprehension Verbalized understanding;Returned demonstration;Need further instruction              PT Short Term Goals - 08/07/21 1101       PT SHORT TERM GOAL #1   Title independent with initial HEP    Time 3    Period Weeks    Status New    Target Date 08/28/21               PT Long Term Goals - 08/07/21 1101       PT LONG TERM GOAL #1   Title independent with final HEP    Time 6    Period Weeks    Status New    Target Date 09/18/21      PT LONG TERM GOAL #2   Title FOTO score improved to 73 for improved function    Time 6    Period Weeks    Status New    Target Date 09/18/21      PT LONG TERM GOAL #3   Title report pain < 2/10 for improved function    Time 6    Period  Weeks    Status New    Target Date 09/18/21      PT LONG TERM GOAL #4   Title demonstrate 83# Rt grip strength in RUE for improved function    Time 6    Period Weeks    Status New    Target Date 09/18/21                    Plan - 08/07/21 1139     Clinical Impression Statement Pt is a 50 y/o male who presents to OPPT for chronic Rt elbow pain.  He demonstrates decreased strength and pain with trigger  points affecting functional mobility. He will benefit from PT to address deficits listed.  Also has referral for Lt knee pain which has resolved so has asked to focus on Rt elbow pain.  Will address Lt knee PRN.    Personal Factors and Comorbidities Comorbidity 3+    Comorbidities anxiety, DM, HTN    Examination-Activity Limitations Reach Overhead;Carry;Lift    Examination-Participation Restrictions Yard Work;Meal Prep;Community Activity    Stability/Clinical Decision Making Stable/Uncomplicated    Clinical Decision Making Low    Rehab Potential Good    PT Frequency 1x / week    PT Duration 6 weeks    PT Treatment/Interventions ADLs/Self Care Home Management;Cryotherapy;Electrical Stimulation;Ultrasound;Moist Heat;Iontophoresis 4mg /ml Dexamethasone;Therapeutic activities;Therapeutic exercise;Patient/family education;Neuromuscular re-education;Manual techniques;Taping;Dry needling;Passive range of motion    PT Next Visit Plan review HEP, assess response to DN, continue PRN    PT Home Exercise Plan Access Code: [email or web address]    Consulted and Agree with Plan of Care Patient             Patient will benefit from skilled therapeutic intervention in order to improve the following deficits and impairments:  Increased fascial restricitons, Increased muscle spasms, Pain, Decreased strength  Visit Diagnosis: Pain in right elbow  Muscle weakness (generalized)  Chronic pain of left knee     Problem List Patient Active Problem List   Diagnosis Date Noted   Lateral epicondylitis, right elbow 08/13/2020   Analgesic overuse headache 02/07/2020   Chronic left-sided headaches 02/07/2020   Cigarette smoker 02/07/2020   Insulin pump status 02/07/2020   Chronic pain of left knee 02/20/2019      Laureen Abrahams, PT, DPT 08/07/21 11:45 AM    [facility] [address] [place], [place], [address] Phone: [phone]   Fax:  [phone]  Name: Thomas Richard MRN: [id number] Date of Birth: 06-21-1971

## 2021-08-13 ENCOUNTER — Telehealth: Payer: Self-pay

## 2021-08-13 ENCOUNTER — Encounter: Payer: Self-pay | Admitting: Gastroenterology

## 2021-08-13 ENCOUNTER — Other Ambulatory Visit: Payer: Self-pay

## 2021-08-13 ENCOUNTER — Ambulatory Visit (INDEPENDENT_AMBULATORY_CARE_PROVIDER_SITE_OTHER): Payer: BC Managed Care – PPO | Admitting: Gastroenterology

## 2021-08-13 ENCOUNTER — Encounter: Payer: Self-pay | Admitting: Physical Therapy

## 2021-08-13 ENCOUNTER — Ambulatory Visit (INDEPENDENT_AMBULATORY_CARE_PROVIDER_SITE_OTHER): Payer: BC Managed Care – PPO | Admitting: Physical Therapy

## 2021-08-13 VITALS — BP 120/70 | HR 98 | Ht 72.0 in | Wt 216.0 lb

## 2021-08-13 DIAGNOSIS — M25521 Pain in right elbow: Secondary | ICD-10-CM | POA: Diagnosis not present

## 2021-08-13 DIAGNOSIS — M6281 Muscle weakness (generalized): Secondary | ICD-10-CM

## 2021-08-13 DIAGNOSIS — Z9641 Presence of insulin pump (external) (internal): Secondary | ICD-10-CM | POA: Diagnosis not present

## 2021-08-13 DIAGNOSIS — Z1211 Encounter for screening for malignant neoplasm of colon: Secondary | ICD-10-CM

## 2021-08-13 NOTE — Therapy (Addendum)
[facility] [address] [place], [place], [address] Phone: [phone]   Fax:  [phone]  Physical Therapy Treatment/Discharge Summary  Patient Details  Name: Thomas Richard MRN: [id number] Date of Birth: 08-Dec-1970 Referring Provider (PT): Leandrew Koyanagi, MD   Encounter Date: 08/13/2021   PT End of Session - 08/13/21 1217     Visit Number 2    Number of Visits 6    Date for PT Re-Evaluation 09/18/21    PT Start Time 1140    PT Stop Time 1206    PT Time Calculation (min) 26 min    Activity Tolerance Patient tolerated treatment well    Behavior During Therapy [facility] for tasks assessed/performed             Past Medical History:  Diagnosis Date   Anxiety    Diabetes mellitus without complication ([place])    ED (erectile dysfunction)    Hyperlipidemia    Hypertension    Hypothyroid    Insomnia    Prostatitis    Smoker     Past Surgical History:  Procedure Laterality Date   WISDOM TOOTH EXTRACTION      There were no vitals filed for this visit.   Subjective Assessment - 08/13/21 1143     Subjective has stopped using the brace and has one area that is still painful    Patient Stated Goals improve pain in elbow, return to working out    Currently in Pain? No/denies                [facility] PT Assessment - 08/13/21 1146       Assessment   Medical Diagnosis M77.11 (ICD-10-CM) - Lateral epicondylitis, right elbow  M25.562,G89.29 (ICD-10-CM) - Chronic pain of left knee    Referring Provider (PT) Leandrew Koyanagi, MD      Observation/Other Assessments   Focus on Therapeutic Outcomes (FOTO)  97      Strength   Right Hand Grip (lbs) 93.5                           [facility] Adult PT Treatment/Exercise - 08/13/21 1215       Exercises   Other Exercises  discussed HEP and he reports compliance as well as adding shoulder and bicep/tricep work as well.  Pt with good understanding of exercise progressiong      Manual  Therapy   Manual therapy comments STM with compression to Rt wrist extensors, biceps              Trigger Point Dry Needling - 08/13/21 1216     Consent Given? Yes    Education Handout Provided Previously provided    Muscles Treated Upper Quadrant Biceps    Biceps Response Twitch response elicited                     PT Short Term Goals - 08/13/21 1217       PT SHORT TERM GOAL #1   Title independent with initial HEP    Time 3    Period Weeks    Status Achieved    Target Date 08/28/21               PT Long Term Goals - 08/13/21 1218       PT LONG TERM GOAL #1   Title independent with final HEP    Time 6    Period Weeks  Status On-going    Target Date 09/18/21      PT LONG TERM GOAL #2   Title FOTO score improved to 73 for improved function    Time 6    Period Weeks    Status Achieved      PT LONG TERM GOAL #3   Title report pain < 2/10 for improved function    Time 6    Period Weeks    Status Achieved      PT LONG TERM GOAL #4   Title demonstrate 83# Rt grip strength in RUE for improved function    Time 6    Period Weeks    Status Achieved                   Plan - 08/13/21 1218     Clinical Impression Statement Pt with excellent response to last visit and reports minimal to no pain.  He still had one trigger point in the biceps with good response to manual therapy and DN today.  Grip strength significantly improved today and all goals met except advanced HEP, which he has excellent understanding of exercise progression.  Pt advised to cx next visit if he has no more symptoms, otherwise will plan to see and address as indicated.    Personal Factors and Comorbidities Comorbidity 3+    Comorbidities anxiety, DM, HTN    Examination-Activity Limitations Reach Overhead;Carry;Lift    Examination-Participation Restrictions Yard Work;Meal Prep;Community Activity    Stability/Clinical Decision Making Stable/Uncomplicated    Rehab  Potential Good    PT Frequency 1x / week    PT Duration 6 weeks    PT Treatment/Interventions ADLs/Self Care Home Management;Cryotherapy;Electrical Stimulation;Ultrasound;Moist Heat;Iontophoresis 27m/ml Dexamethasone;Therapeutic activities;Therapeutic exercise;Patient/family education;Neuromuscular re-education;Manual techniques;Taping;Dry needling;Passive range of motion    PT Next Visit Plan address pain PRN, manual/DN PRN    PT Home Exercise Plan Access Code: [email or web address]   Consulted and Agree with Plan of Care Patient             Patient will benefit from skilled therapeutic intervention in order to improve the following deficits and impairments:  Increased fascial restricitons, Increased muscle spasms, Pain, Decreased strength  Visit Diagnosis: Pain in right elbow  Muscle weakness (generalized)     Problem List Patient Active Problem List   Diagnosis Date Noted   Lateral epicondylitis, right elbow 08/13/2020   Analgesic overuse headache 02/07/2020   Chronic left-sided headaches 02/07/2020   Cigarette smoker 02/07/2020   Insulin pump status 02/07/2020   Chronic pain of left knee 02/20/2019      SLaureen Abrahams PT, DPT 08/13/21 12:22 PM     [facility] [address] [place] [address]Phone: [phone]  Fax:  [phone] Name: Thomas BarfootMRN: [id number] of Birth: 108-Jan-1972   PHYSICAL THERAPY DISCHARGE SUMMARY  Visits from Start of Care: 2  Current functional level related to goals / functional outcomes: See above   Remaining deficits: N/a   Education / Equipment: HEP, DN   Patient agrees to discharge. Patient goals were met. Patient is being discharged due to meeting the stated rehab goals.  SLaureen Abrahams PT, DPT 09/15/21 12:34 PM  [facility] [address] [address]Phone: [phone]  Fax:  [phone]

## 2021-08-13 NOTE — Patient Instructions (Signed)
You have been scheduled for a colonoscopy. Please follow written instructions given to you at your visit today.  Please pick up your prep supplies at the pharmacy within the next 1-3 days. If you use inhalers (even only as needed), please bring them with you on the day of your procedure.    If you are age 51 or younger, your body mass index should be between 19-25. Your Body mass index is 29.29 kg/m. If this is out of the aformentioned range listed, please consider follow up with your Primary Care Provider.   ________________________________________________________  The [facility] providers would like to encourage you to use [facility] to communicate with providers for non-urgent requests or questions.  Due to long hold times on the telephone, sending your provider a message by [facility] may be a faster and more efficient way to get a response.  Please allow 48 business hours for a response.  Please remember that this is for non-urgent requests.  _______________________________________________________  Thank you for choosing me and [facility].  Dr. Rush Landmark

## 2021-08-13 NOTE — Telephone Encounter (Signed)
Insulin Pump letter sent to [facility] office.  Pt scheduled for 08/25/21 Colonoscopy.

## 2021-08-14 ENCOUNTER — Encounter: Payer: Self-pay | Admitting: Gastroenterology

## 2021-08-14 DIAGNOSIS — Z1211 Encounter for screening for malignant neoplasm of colon: Secondary | ICD-10-CM | POA: Insufficient documentation

## 2021-08-14 DIAGNOSIS — Z9641 Presence of insulin pump (external) (internal): Secondary | ICD-10-CM | POA: Insufficient documentation

## 2021-08-14 NOTE — Progress Notes (Signed)
[place] VISIT   Primary Care Provider Thomas Baton, MD [address] [place] [address] [phone]  Referring Provider Thomas Baton, MD [address] [place],  [place] [address] [phone]  Patient Profile: Thomas Richard is a 50 y.o. male with a pmh significant for insulin-dependent diabetes (with insulin pump), hypertension, hyperlipidemia, hypothyroidism, anxiety.  The patient presents to the [facility] for an evaluation and management of problem(s) noted below:  Problem List 1. Colon cancer screening   2. Insulin pump in place     History of Present Illness This is the patient's first visit to the outpatient [facility] clinic.  The patient is referred for colon cancer screening evaluation.  Due to his insulin pump, clinic visit was set up to further discuss neck steps in evaluation.  The patient has between 1 and 3 bowel movements on a daily basis.  They are formed to mostly semiformed.  He does have gas/flatulence but has not thought of any issues with this.  He denies any abdominal pain or discomfort.  He has no nausea or vomiting.  He has not had an alteration of his bowel habits.  He has not ever had a colonoscopy or any other stool based testing for colon cancer screening.  GI Review of Systems Positive as above Negative for pyrosis, dysphagia, odynophagia, nausea, vomiting, bloating, melena, hematochezia  Review of Systems General: Denies fevers/chills/weight loss unintentionally HEENT: Denies oral lesions Cardiovascular: Denies chest pain/palpitations Pulmonary: Denies shortness of breath Gastroenterological: See HPI Genitourinary: Denies darkened urine Hematological: Denies easy bruising/bleeding Dermatological: Denies jaundice Psychological: Mood is stable   Medications Current Outpatient Medications  Medication Sig Dispense Refill   ALPRAZolam (XANAX) 1 MG tablet Take 1 mg by mouth at bedtime as needed for  anxiety.     amLODipine (NORVASC) 10 MG tablet Take 10 mg by mouth daily.     aspirin 81 MG tablet Take 81 mg by mouth daily.     atorvastatin (LIPITOR) 40 MG tablet Take 40 mg by mouth daily.     Continuous Blood Gluc Sensor (FREESTYLE LIBRE 14 DAY SENSOR) MISC SMARTSIG:1 Each Topical Every 2 Weeks     escitalopram (LEXAPRO) 10 MG tablet Take 10 mg by mouth daily.     insulin lispro (HUMALOG) 100 UNIT/ML injection Inject into the skin See admin instructions. Via Insulin Pump     levothyroxine (SYNTHROID, LEVOTHROID) 75 MCG tablet Take 75-150 mcg by mouth daily before breakfast. 150 mcg on Sat, and 75 mcg all other days     loratadine (CLARITIN) 10 MG tablet Take 10 mg by mouth daily.     Multiple Vitamin (MULTIVITAMIN) tablet Take 1 tablet by mouth daily.     Multiple Vitamins-Minerals (MULTIVITAMIN WITH MINERALS) tablet Take 1 tablet by mouth daily.     olmesartan (BENICAR) 40 MG tablet Take 40 mg by mouth daily.     spironolactone (ALDACTONE) 25 MG tablet Take 25 mg by mouth daily.     No current facility-administered medications for this visit.    Allergies No Known Allergies  Histories Past Medical History:  Diagnosis Date   Anxiety    Diabetes mellitus without complication ([place])    ED (erectile dysfunction)    Hyperlipidemia    Hypertension    Hypothyroid    Insomnia    Prostatitis    Smoker    Past Surgical History:  Procedure Laterality Date   WISDOM TOOTH EXTRACTION  1996   Social History   Socioeconomic History  Marital status: Single    Spouse name: Not on file   Number of children: 0   Years of education: Not on file   Highest education level: Not on file  Occupational History   Not on file  Tobacco Use   Smoking status: Every Day    Packs/day: 1.00    Years: 10.00    Pack years: 10.00    Types: Cigarettes   Smokeless tobacco: Never  Vaping Use   Vaping Use: Never used  Substance and Sexual Activity   Alcohol use: Yes    Alcohol/week: 0.0  standard drinks    Comment: 2 beers per dasy   Drug use: No   Sexual activity: Not on file  Other Topics Concern   Not on file  Social History Narrative   Financial advisor Thomas Richard      Drinks 2-3 cups of coffee in the morning.   Social Determinants of Health   Financial Resource Strain: Not on file  Food Insecurity: Not on file  Transportation Needs: Not on file  Physical Activity: Not on file  Stress: Not on file  Social Connections: Not on file  Intimate Partner Violence: Not on file   Family History  Problem Relation Age of Onset   Depression Mother    Lung disease Father    Diabetes Father    Hypertension Father    Colon cancer Neg Hx    Esophageal cancer Neg Hx    Rectal cancer Neg Hx    Inflammatory bowel disease Neg Hx    Liver disease Neg Hx    Pancreatic cancer Neg Hx    Stomach cancer Neg Hx    I have reviewed his medical, social, and family history in detail and updated the electronic medical record as necessary.    PHYSICAL EXAMINATION  BP 120/70   Pulse 98   Ht 6' (1.829 m)   Wt 216 lb (98 kg)   BMI 29.29 kg/m  Wt Readings from Last 3 Encounters:  08/13/21 216 lb (98 kg)  06/30/21 210 lb (95.3 kg)  02/07/20 221 lb (100.2 kg)  GEN: NAD, appears stated age, doesn't appear chronically ill PSYCH: Cooperative, without pressured speech EYE: Conjunctivae pink, sclerae anicteric ENT: MMM, without oral ulcers CV: RR without R/Gs  RESP: CTAB posteriorly, without wheezing GI: NABS, soft, NT/ND, small ventral diastases present, without rebound or guarding, no HSM appreciated MSK/EXT: No lower extremity edema SKIN: No jaundice NEURO:  Alert & Oriented x 3, no focal deficits   REVIEW OF DATA  I reviewed the following data at the time of this encounter:  GI Procedures and Studies  No relevant studies to review  Laboratory Studies  Reviewed those in epic  Imaging Studies  No relevant studies to review   ASSESSMENT  Mr. Thomas Richard is a 50 y.o.  male with a pmh significant for insulin-dependent diabetes (with insulin pump), hypertension, hyperlipidemia, hypothyroidism, anxiety.  The patient is seen today for evaluation and management of:  1. Colon cancer screening   2. Insulin pump in place    The patient is clinically and hemodynamically stable.  The patient is due for colon cancer screening.  We discussed the different modalities of colon cancer screening including colonoscopy, Cologuard testing, FIT based testing, virtual colonoscopy.  The patient is otherwise a very healthy individual and is felt to be an adequate candidate for anesthesia.  Thus to both be a screening method and potentially preventative, we will move forward with colonoscopy for colon  cancer screening.  The risks and benefits of endoscopic evaluation were discussed with the patient; these include but are not limited to the risk of perforation, infection, bleeding, missed lesions, lack of diagnosis, severe illness requiring hospitalization, as well as anesthesia and sedation related illnesses.  The patient and/or family is agreeable to proceed.  We will ask the patient's PCP for insulin titration though I suspect the patient himself who has had procedures in the past and adjusted his basal insulin dose will be able to take care of this we will ensure no issues from a PCP perspective.  All patient questions were answered to the best of my ability, and the patient agrees to the aforementioned plan of action with follow-up as indicated.   PLAN  Proceed with scheduling colonoscopy - Okay for breakfast day prior and if hypoglycemia develops then okay for any intake of fluid/food as necessary to raise blood sugars Will reach out to PCP for insulin pump adjustment (though suspect patient will be able to adjust his dosing accordingly   Orders Placed This Encounter  Procedures   Ambulatory referral to Gastroenterology    New Prescriptions   No medications on file   Modified  Medications   No medications on file    Planned Follow Up No follow-ups on file.   Total Time in Face-to-Face and in Coordination of Care for patient including independent/personal interpretation/review of prior testing, medical history, examination, medication adjustment, communicating results with the patient directly, and documentation within the EHR is 35 minutes.  Proceed with scheduling colonoscopy   Justice Britain, MD [facility] Advanced Endoscopy Office # [phone]

## 2021-08-17 ENCOUNTER — Encounter: Payer: BC Managed Care – PPO | Admitting: Physical Therapy

## 2021-08-18 ENCOUNTER — Encounter: Payer: Self-pay | Admitting: Gastroenterology

## 2021-08-24 ENCOUNTER — Encounter: Payer: BC Managed Care – PPO | Admitting: Physical Therapy

## 2021-08-24 NOTE — Telephone Encounter (Signed)
Spoke with patient this afternoon and he informed me that Dr Virgina Jock office contacted him and gave him instructions how to adjust his insulin pump.

## 2021-08-25 ENCOUNTER — Ambulatory Visit (AMBULATORY_SURGERY_CENTER): Payer: BC Managed Care – PPO | Admitting: Gastroenterology

## 2021-08-25 ENCOUNTER — Other Ambulatory Visit: Payer: Self-pay

## 2021-08-25 ENCOUNTER — Encounter: Payer: Self-pay | Admitting: Gastroenterology

## 2021-08-25 ENCOUNTER — Other Ambulatory Visit: Payer: Self-pay | Admitting: Gastroenterology

## 2021-08-25 VITALS — BP 152/89 | HR 66 | Temp 98.2°F | Resp 19 | Ht 72.0 in | Wt 216.0 lb

## 2021-08-25 DIAGNOSIS — K635 Polyp of colon: Secondary | ICD-10-CM | POA: Diagnosis not present

## 2021-08-25 DIAGNOSIS — D123 Benign neoplasm of transverse colon: Secondary | ICD-10-CM

## 2021-08-25 DIAGNOSIS — Z1211 Encounter for screening for malignant neoplasm of colon: Secondary | ICD-10-CM | POA: Diagnosis present

## 2021-08-25 MED ORDER — SODIUM CHLORIDE 0.9 % IV SOLN: 500.0000 mL | Freq: Once | INTRAVENOUS | Status: DC

## 2021-08-25 NOTE — Progress Notes (Signed)
Called to room to assist during endoscopic procedure.  Patient ID and intended procedure confirmed with present staff. Received instructions for my participation in the procedure from the performing physician.  

## 2021-08-25 NOTE — Progress Notes (Signed)
Report to PACU, RN, vss, BBS= Clear.  

## 2021-08-25 NOTE — Progress Notes (Signed)
GASTROENTEROLOGY PROCEDURE H&P NOTE   Primary Care Physician: Shon Baton, MD  HPI: Thomas Richard is a 50 y.o. male who presents for Colonoscopy for screening.  Past Medical History:  Diagnosis Date   Anxiety    Diabetes mellitus without complication ([place])    ED (erectile dysfunction)    Hyperlipidemia    Hypertension    Hypothyroid    Insomnia    Prostatitis    Smoker    Past Surgical History:  Procedure Laterality Date   WISDOM TOOTH EXTRACTION  1996   Current Outpatient Medications  Medication Sig Dispense Refill   ALPRAZolam (XANAX) 1 MG tablet Take 1 mg by mouth at bedtime as needed for anxiety.     amLODipine (NORVASC) 10 MG tablet Take 10 mg by mouth daily.     aspirin 81 MG tablet Take 81 mg by mouth daily.     atorvastatin (LIPITOR) 40 MG tablet Take 40 mg by mouth daily.     Continuous Blood Gluc Sensor (FREESTYLE LIBRE 14 DAY SENSOR) MISC SMARTSIG:1 Each Topical Every 2 Weeks     escitalopram (LEXAPRO) 10 MG tablet Take 10 mg by mouth daily.     insulin lispro (HUMALOG) 100 UNIT/ML injection Inject into the skin See admin instructions. Via Insulin Pump     levothyroxine (SYNTHROID, LEVOTHROID) 75 MCG tablet Take 75-150 mcg by mouth daily before breakfast. 150 mcg on Sat, and 75 mcg all other days     loratadine (CLARITIN) 10 MG tablet Take 10 mg by mouth daily.     Multiple Vitamin (MULTIVITAMIN) tablet Take 1 tablet by mouth daily.     Multiple Vitamins-Minerals (MULTIVITAMIN WITH MINERALS) tablet Take 1 tablet by mouth daily.     olmesartan (BENICAR) 40 MG tablet Take 40 mg by mouth daily.     spironolactone (ALDACTONE) 25 MG tablet Take 25 mg by mouth daily.     Current Facility-Administered Medications  Medication Dose Route Frequency Provider Last Rate Last Admin   0.9 %  sodium chloride infusion  500 mL Intravenous Once Mansouraty, Telford Nab., MD        Current Outpatient Medications:    ALPRAZolam (XANAX) 1 MG tablet, Take 1 mg by mouth at bedtime as  needed for anxiety., Disp: , Rfl:    amLODipine (NORVASC) 10 MG tablet, Take 10 mg by mouth daily., Disp: , Rfl:    aspirin 81 MG tablet, Take 81 mg by mouth daily., Disp: , Rfl:    atorvastatin (LIPITOR) 40 MG tablet, Take 40 mg by mouth daily., Disp: , Rfl:    Continuous Blood Gluc Sensor (FREESTYLE LIBRE 14 DAY SENSOR) MISC, SMARTSIG:1 Each Topical Every 2 Weeks, Disp: , Rfl:    escitalopram (LEXAPRO) 10 MG tablet, Take 10 mg by mouth daily., Disp: , Rfl:    insulin lispro (HUMALOG) 100 UNIT/ML injection, Inject into the skin See admin instructions. Via Insulin Pump, Disp: , Rfl:    levothyroxine (SYNTHROID, LEVOTHROID) 75 MCG tablet, Take 75-150 mcg by mouth daily before breakfast. 150 mcg on Sat, and 75 mcg all other days, Disp: , Rfl:    loratadine (CLARITIN) 10 MG tablet, Take 10 mg by mouth daily., Disp: , Rfl:    Multiple Vitamin (MULTIVITAMIN) tablet, Take 1 tablet by mouth daily., Disp: , Rfl:    Multiple Vitamins-Minerals (MULTIVITAMIN WITH MINERALS) tablet, Take 1 tablet by mouth daily., Disp: , Rfl:    olmesartan (BENICAR) 40 MG tablet, Take 40 mg by mouth daily., Disp: , Rfl:  spironolactone (ALDACTONE) 25 MG tablet, Take 25 mg by mouth daily., Disp: , Rfl:   Current Facility-Administered Medications:    0.9 %  sodium chloride infusion, 500 mL, Intravenous, Once, Mansouraty, Telford Nab., MD No Known Allergies Family History  Problem Relation Age of Onset   Depression Mother    Lung disease Father    Diabetes Father    Hypertension Father    Colon cancer Neg Hx    Esophageal cancer Neg Hx    Rectal cancer Neg Hx    Inflammatory bowel disease Neg Hx    Liver disease Neg Hx    Pancreatic cancer Neg Hx    Stomach cancer Neg Hx    Social History   Socioeconomic History   Marital status: Single    Spouse name: Not on file   Number of children: 0   Years of education: Not on file   Highest education level: Not on file  Occupational History   Not on file  Tobacco Use    Smoking status: Every Day    Packs/day: 1.00    Years: 10.00    Pack years: 10.00    Types: Cigarettes   Smokeless tobacco: Never  Vaping Use   Vaping Use: Never used  Substance and Sexual Activity   Alcohol use: Yes    Alcohol/week: 0.0 standard drinks    Comment: 2 beers per dasy   Drug use: No   Sexual activity: Not on file  Other Topics Concern   Not on file  Social History Narrative   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   Social Determinants of Health   Financial Resource Strain: Not on file  Food Insecurity: Not on file  Transportation Needs: Not on file  Physical Activity: Not on file  Stress: Not on file  Social Connections: Not on file  Intimate Partner Violence: Not on file    Physical Exam: Today's Vitals   08/25/21 0714 08/25/21 0754  BP: 128/69 (!) 150/93  Pulse: 74 69  Resp:  (!) 9  Temp: 98.2 F (36.8 C)   TempSrc: Temporal   SpO2: 94% 95%  Weight: 216 lb (98 kg)   Height: 6' (1.829 m)    Body mass index is 29.29 kg/m. GEN: NAD EYE: Sclerae anicteric ENT: MMM CV: Non-tachycardic GI: Soft, NT/ND NEURO:  Alert & Oriented x 3  Lab Results: No results for input(s): WBC, HGB, HCT, PLT in the last 72 hours. BMET No results for input(s): NA, K, CL, CO2, GLUCOSE, BUN, CREATININE, CALCIUM in the last 72 hours. LFT No results for input(s): PROT, ALBUMIN, AST, ALT, ALKPHOS, BILITOT, BILIDIR, IBILI in the last 72 hours. PT/INR No results for input(s): LABPROT, INR in the last 72 hours.   Impression / Plan: This is a 50 y.o.male who presents for Colonoscopy for screening.  The risks and benefits of endoscopic evaluation/treatment were discussed with the patient and/or family; these include but are not limited to the risk of perforation, infection, bleeding, missed lesions, lack of diagnosis, severe illness requiring hospitalization, as well as anesthesia and sedation related illnesses.  The patient's  history has been reviewed, patient examined, no change in status, and deemed stable for procedure.  The patient and/or family is agreeable to proceed.    Justice Britain, MD [facility] Office # [phone]

## 2021-08-25 NOTE — Progress Notes (Signed)
Pt is HIPPA.  His neighbor is his care partner.  Only went over findings in  recovery with pt.  Sealed AVS in envelope.  No problems noted in the recovery room. maw

## 2021-08-25 NOTE — Progress Notes (Signed)
VS completed by CW.  Medical and surgical history reviewed, no changes made.

## 2021-08-25 NOTE — Op Note (Addendum)
[place] Patient Name: Thomas Richard Procedure Date: 08/25/2021 7:35 AM MRN: [id number] Endoscopist: Justice Britain , MD Age: 50 Referring MD:  Date of Birth: 1971/02/21 Gender: Male Account #: [id number] Procedure:                Colonoscopy Indications:              Screening for colorectal malignant neoplasm, This                            is the patient's first colonoscopy Medicines:                Monitored Anesthesia Care Procedure:                Pre-Anesthesia Assessment:                           - Prior to the procedure, a History and Physical                            was performed, and patient medications and                            allergies were reviewed. The patient's tolerance of                            previous anesthesia was also reviewed. The risks                            and benefits of the procedure and the sedation                            options and risks were discussed with the patient.                            All questions were answered, and informed consent                            was obtained. Prior Anticoagulants: The patient has                            taken no previous anticoagulant or antiplatelet                            agents except for aspirin. ASA Grade Assessment: II                            - A patient with mild systemic disease. After                            reviewing the risks and benefits, the patient was                            deemed in satisfactory condition to undergo the  procedure.                           After obtaining informed consent, the colonoscope                            was passed under direct vision. Throughout the                            procedure, the patient's blood pressure, pulse, and                            oxygen saturations were monitored continuously. The                            Olympus CF-HQ190L ([id number]) Colonoscope was                             introduced through the anus and advanced to the 5                            cm into the ileum. The colonoscopy was performed                            without difficulty. The patient tolerated the                            procedure. The quality of the bowel preparation was                            adequate. The terminal ileum, ileocecal valve,                            appendiceal orifice, and rectum were photographed. Scope In: 8:01:08 AM Scope Out: 8:21:54 AM Scope Withdrawal Time: 0 hours 17 minutes 45 seconds  Total Procedure Duration: 0 hours 20 minutes 46 seconds  Findings:                 The digital rectal exam findings include                            hemorrhoids. Pertinent negatives include no                            palpable rectal lesions.                           A moderate amount of semi-liquid stool was found in                            the entire colon, making visualization difficult.                            Lavage of the area was performed using copious  amounts, resulting in clearance with adequate                            visualization.                           The terminal ileum and ileocecal valve appeared                            normal.                           A 3 mm polyp was found in the proximal transverse                            colon. The polyp was sessile. The polyp was removed                            with a cold snare. Resection and retrieval were                            complete.                           Multiple small-mouthed diverticula were found in                            the recto-sigmoid colon, sigmoid colon and                            descending colon.                           Normal mucosa was found in the entire colon                            otherwise.                           Non-bleeding non-thrombosed external and internal                             hemorrhoids were found during retroflexion, during                            perianal exam and during digital exam. The                            hemorrhoids were Grade II (internal hemorrhoids                            that prolapse but reduce spontaneously). Complications:            No immediate complications. Estimated Blood Loss:     Estimated blood loss was minimal. Impression:               - Hemorrhoids found on digital rectal exam.                           -  Stool in the entire examined colon.                           - The examined portion of the ileum was normal.                           - One 3 mm polyp in the proximal transverse colon,                            removed with a cold snare. Resected and retrieved.                           - Diverticulosis in the recto-sigmoid colon, in the                            sigmoid colon and in the descending colon.                           - Normal mucosa in the entire examined colon                            otherwise.                           - Non-bleeding non-thrombosed external and internal                            hemorrhoids. Recommendation:           - The patient will be observed post-procedure,                            until all discharge criteria are met.                           - Discharge patient to home.                           - Patient has a contact number available for                            emergencies. The signs and symptoms of potential                            delayed complications were discussed with the                            patient. Return to normal activities tomorrow.                            Written discharge instructions were provided to the                            patient.                           -  High fiber diet.                           - Use FiberCon 1-2 tablets PO daily.                           - Continue present medications.                           - Await  pathology results.                           - Repeat colonoscopy in 02/07/09 years for                            surveillance based on pathology results and                            findings of adenomatous tissue.                           - The findings and recommendations were discussed                            with the patient. Justice Britain, MD 08/25/2021 8:30:31 AM

## 2021-08-25 NOTE — Patient Instructions (Addendum)
Handouts were given to your care partner on polyps, diverticulosis, hemorrhoids and a high fiber diet with liberal fluid intake. Sugar was 130 in the recovery room. Use over the counter FIBERCON 1-2 tablets by mouth daily. You may resume your current medications today. Await biopsy results.  May take 1-3 weeks to receive pathology results. Please call if any questions or concerns.      YOU HAD AN ENDOSCOPIC PROCEDURE TODAY AT [facility]:   Refer to the procedure report that was given to you for any specific questions about what was found during the examination.  If the procedure report does not answer your questions, please call your gastroenterologist to clarify.  If you requested that your care partner not be given the details of your procedure findings, then the procedure report has been included in a sealed envelope for you to review at your convenience later.  YOU SHOULD EXPECT: Some feelings of bloating in the abdomen. Passage of more gas than usual.  Walking can help get rid of the air that was put into your GI tract during the procedure and reduce the bloating. If you had a lower endoscopy (such as a colonoscopy or flexible sigmoidoscopy) you may notice spotting of blood in your stool or on the toilet paper. If you underwent a bowel prep for your procedure, you may not have a normal bowel movement for a few days.  Please Note:  You might notice some irritation and congestion in your nose or some drainage.  This is from the oxygen used during your procedure.  There is no need for concern and it should clear up in a day or so.  SYMPTOMS TO REPORT IMMEDIATELY:  Following lower endoscopy (colonoscopy or flexible sigmoidoscopy):  Excessive amounts of blood in the stool  Significant tenderness or worsening of abdominal pains  Swelling of the abdomen that is new, acute  Fever of 100F or higher   For urgent or emergent issues, a gastroenterologist can be reached at any  hour by calling [phone]. Do not use MyChart messaging for urgent concerns.    DIET:  We do recommend a small meal at first, but then you may proceed to your regular diet.  Drink plenty of fluids but you should avoid alcoholic beverages for 24 hours.  ACTIVITY:  You should plan to take it easy for the rest of today and you should NOT DRIVE or use heavy machinery until tomorrow (because of the sedation medicines used during the test).    FOLLOW UP: Our staff will call the number listed on your records 48-72 hours following your procedure to check on you and address any questions or concerns that you may have regarding the information given to you following your procedure. If we do not reach you, we will leave a message.  We will attempt to reach you two times.  During this call, we will ask if you have developed any symptoms of COVID 19. If you develop any symptoms (ie: fever, flu-like symptoms, shortness of breath, cough etc.) before then, please call [phone].  If you test positive for Covid 19 in the 2 weeks post procedure, please call and report this information to [place].    If any biopsies were taken you will be contacted by phone or by letter within the next 1-3 weeks.  Please call us at [phone] if you have not heard about the biopsies in 3 weeks.    SIGNATURES/CONFIDENTIALITY: You and/or your care partner have signed  paperwork which will be entered into your electronic medical record.  These signatures attest to the fact that that the information above on your After Visit Summary has been reviewed and is understood.  Full responsibility of the confidentiality of this discharge information lies with you and/or your care-partner.

## 2021-08-31 ENCOUNTER — Telehealth: Payer: Self-pay

## 2021-08-31 ENCOUNTER — Telehealth: Payer: Self-pay | Admitting: *Deleted

## 2021-08-31 NOTE — Telephone Encounter (Signed)
  Follow up Call-  Call back number 08/25/2021  Post procedure Call Back phone  # [phone]  Permission to leave phone message Yes  Some recent data might be hidden     Patient questions: Message left to call us if necessary.

## 2021-08-31 NOTE — Telephone Encounter (Signed)
No answer, left message to call back later today, B.Rosemarie Galvis RN. 

## 2021-09-02 ENCOUNTER — Encounter: Payer: Self-pay | Admitting: Gastroenterology

## 2022-02-23 ENCOUNTER — Telehealth: Payer: Self-pay

## 2022-02-23 NOTE — Telephone Encounter (Signed)
Pt left a vm on sleep lab main line, asking to schedule sleep study. Pt was last seen in 2021, and did not schedule sleep study at that time. That order is expired now. Pt will need to have office visit with Dr. Brett Fairy or NP, then a new order for sleep study will need to be placed.   I tried calling pt but unable to contact. LVM for pt to give me a call back at my direct number.

## 2022-04-27 ENCOUNTER — Encounter: Payer: Self-pay | Admitting: Neurology

## 2022-04-27 ENCOUNTER — Ambulatory Visit: Payer: BC Managed Care – PPO | Admitting: Neurology

## 2022-04-27 VITALS — BP 122/68 | HR 78 | Ht 72.0 in | Wt 211.0 lb

## 2022-04-27 DIAGNOSIS — R0683 Snoring: Secondary | ICD-10-CM | POA: Diagnosis not present

## 2022-04-27 DIAGNOSIS — M2619 Other specified anomalies of jaw-cranial base relationship: Secondary | ICD-10-CM

## 2022-04-27 DIAGNOSIS — G4763 Sleep related bruxism: Secondary | ICD-10-CM | POA: Diagnosis not present

## 2022-04-27 DIAGNOSIS — R0681 Apnea, not elsewhere classified: Secondary | ICD-10-CM

## 2022-04-27 DIAGNOSIS — F17200 Nicotine dependence, unspecified, uncomplicated: Secondary | ICD-10-CM

## 2022-04-27 NOTE — Progress Notes (Signed)
SLEEP MEDICINE CLINIC   Provider:  Larey Seat, M D  Referring Provider: Shon Baton, MD Primary Care Physician:  Shon Baton, MD  Chief Complaint  Patient presents with   Follow-up    Pt alone, rm 11. Presents today to evaluate sleep apnea a concern. His GF witnesses apnea episodes as well as snoring. He avg 7/8 hrs of sleep and wakes up 1-2 times to go to the bathroom able to fall back asleep easily. Never had a SS   Chief complaint according to patient :  HPI:  Thomas Richard is a 51 y.o. male , has been seen here 11/2015 upon referral from Dr. Virgina Jock for a sleep consultation, was then , in 2021,  seen for a headache evaluation.  Today, 04-27-2022 again for sleep evaluation: Presents today to evaluate sleep apnea a concern. His GF witnesses apnea episodes as well as snoring. He avg 7/8 hrs of sleep and wakes up 1-2 times to go to the bathroom able to fall back asleep easily. He never proceeded to have  a PSG, which was not covered by his insurance.His GF confirmed he is having apneas, he is grinding his teeth. His dentist Dr. Delman Cheadle created created a dental device for him to prevent bruxism marks.  The patient feels this has helped his headaches TMJ but it has not affected snoring or possible apnea. He is here today to address this.  When I saw the patient in the past he was neither excessively fatigued nor daytime sleepy and this is true today again his Epworth Sleepiness Scale is endorsed at 1 out of 24 points his fatigue severity score was 17 out of 63 points. He is still a smoker , 30 cigarettes a day. Also has been vaping to cut down on cig.   He is drinking beer 2-3 a week. He drinks miller light - low carbs ,is a diabetic.  Mr. [place] dentist has now asked him if he has bruxism, and was concerned about OSA as well.   The current comorbidities of hypertension and diabetes were endorsed in 2017. The patient remains an active tobacco user but has not been diagnosed with any primary  pulmonary diseases. He is smoking tobacco he has kept his weight steady but his diabetes was not always well controlled. 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 4 times a week.       02-07-2020:  The patient reports that he had experienced a rising frequency of headaches, usually arising on the left side of the head and for a year he has taken 8-10 ADVIL DAILY(!). He describes a throbbing left temple trigger region. When physically active he is not more bothered by his headaches, may even not feel them at all. He is congested, seasonal, and wakes up with stuffy nose.   Reports he is a nose breather .  He has not seen his eye doctor last year, and is reluctant to take a steroid taper due to diabetes.  He had been snoring for many years but didn't feel at the time that his sleep quality was interrupted or impaired. He then was witnessed by a girlfriend to snore but also to stop breathing at night, since she works as a Marine scientist she was well aware of the associated risk factors and health risks.  Mr. [place] dentist has now asked him if he has bruxism, and was concerned about OSA as well.   The current  comorbidities of hypertension and diabetes were endorsed in 2017. The patient remains an active tobacco user but has not been diagnosed with any primary pulmonary diseases. He is smoking tobacco he has kept his weight steady but his diabetes was not always well controlled. 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  Sleep habits are as follows: His regular bedtime is 10.30 Pm after watching TV and goes to sleep usually promptly. His Bedroom is cool, quiet and dark, he prefers to sleep in the fetal position. He is divorced and sleeps with his dog .  He sleeps easily through the night. He has 2-3 nocturia breaks, and goes easily back to sleep. He feels usually spontaneously between 7 and  7.2 30 AM and feels good,  No morning headaches , but sometimes a  dry mouth is noted.   He drinks 1-2 cups of coffee in AM (one mug), no SODA , no Iced tea .  Water during the rest of the day. He smokes about 1 ppd, for the last 12 years. ETOH ; he drinks 2-3 beer, not daily.  Sleep medical history and family sleep history:  Not aware of childhood sleep disorders, nobody with diagnosed apnea.   Social history: childless, divorced, one Magazine features editor, Music therapist.    Review of Systems: Out of a complete 14 system review, the patient complains of only the following symptoms, and all other reviewed systems are negative. How likely are you to doze in the following situations: 0 = not likely, 1 = slight chance, 2 = moderate chance, 3 = high chance.  Sitting and Reading? 0 Watching Television? 1 Sitting inactive in a public place (theater or meeting)? 0 Lying down in the afternoon when circumstances permit? 1 Sitting and talking to someone? 1 Sitting quietly after lunch without alcohol? 0 In a car, while stopped for a few minutes in traffic?0 As a passenger in a car for an hour without a break?0  Total = 1/ 24    Fatigue severity score 17/ 63   ,PHQ depression score 2   Social History   Socioeconomic History   Marital status: Single    Spouse name: Not on file   Number of children: 0   Years of education: Not on file   Highest education level: Not on file  Occupational History   Not on file  Tobacco Use   Smoking status: Every Day    Packs/day: 1.00    Years: 10.00    Total pack years: 10.00    Types: Cigarettes   Smokeless tobacco: Never  Vaping Use   Vaping Use: Never used  Substance and Sexual Activity   Alcohol use: Yes    Alcohol/week: 0.0 standard drinks of alcohol    Comment: 2 beers per dasy   Drug use: No   Sexual activity: Not on file  Other Topics Concern   Not on file  Social History Narrative   Financial advisor Agustina Caroli      Drinks 2-3 cups of coffee  in the morning.   Social Determinants of Health   Financial Resource Strain: Not on file  Food Insecurity: Not on file  Transportation Needs: Not on file  Physical Activity: Not on file  Stress: Not on file  Social Connections: Not on file  Intimate Partner Violence: Not on file    Family History  Problem Relation Age of Onset   Depression Mother    Lung  disease Father    Diabetes Father    Hypertension Father     Neg Hx     Neg Hx     Neg Hx     Neg Hx     Neg Hx     Neg Hx     Neg Hx     Past Medical History:  Diagnosis Date   Anxiety    Diabetes mellitus without complication ([place])    ED (erectile dysfunction)    Hyperlipidemia    Hypertension    Hypothyroid    Bruxism, snoring, apnea     Prostatitis    Smoker,      Past Surgical History:  Procedure Laterality Date   WISDOM TOOTH EXTRACTION  1996    Current Outpatient Medications  Medication Sig Dispense Refill   ALPRAZolam (XANAX) 1 MG tablet Take 1 mg by mouth at bedtime as needed for anxiety.     amLODipine (NORVASC) 10 MG tablet Take 10 mg by mouth daily.     aspirin 81 MG tablet Take 81 mg by mouth daily.     atorvastatin (LIPITOR) 40 MG tablet Take 40 mg by mouth daily.     Continuous Blood Gluc Sensor (FREESTYLE LIBRE 14 DAY SENSOR) MISC SMARTSIG:1 Each Topical Every 2 Weeks     escitalopram (LEXAPRO) 10 MG tablet Take 10 mg by mouth daily.     insulin lispro (HUMALOG) 100 UNIT/ML injection Inject into the skin See admin instructions. Via Insulin Pump     levothyroxine (SYNTHROID, LEVOTHROID) 75 MCG tablet Take 75-150 mcg by mouth daily before breakfast. 150 mcg on Sat, and 75 mcg all other days     loratadine (CLARITIN) 10 MG tablet Take 10 mg by mouth daily.     Multiple Vitamin (MULTIVITAMIN) tablet Take 1 tablet by mouth daily.     Multiple Vitamins-Minerals (MULTIVITAMIN WITH MINERALS) tablet Take 1 tablet by mouth daily.     olmesartan (BENICAR) 40 MG tablet Take 40 mg by mouth daily.      spironolactone (ALDACTONE) 25 MG tablet Take 25 mg by mouth daily.     No current facility-administered medications for this visit.    Allergies as of 04/27/2022   (No Known Allergies)    Vitals: BP 122/68   Pulse 78   Ht 6' (1.829 m)   Wt 211 lb (95.7 kg)   BMI 28.62 kg/m  Last Weight:  Wt Readings from Last 1 Encounters:  04/27/22 211 lb (95.7 kg)   TIW:PYKD mass index is 28.62 kg/m.     Last Height:   Ht Readings from Last 1 Encounters:  04/27/22 6' (1.829 m)    Physical exam:  General: The patient is awake, alert and appears not in acute distress. The patient is well groomed. Head: Normocephalic, atraumatic. Neck is supple.  Mallampati 2 ,  neck circumference: 17.0. Nasal airflow unrestricted,  TMJ is evident . Retrognathia is mild. No click . Cardiovascular:  Regular rate and rhythm , without  murmurs or carotid bruit, and without distended neck veins. Respiratory: Lungs are clear to auscultation. Skin: facial erythema, palmar erythema.   This was present when I have last seen the patient.  Trunk: Neurologic exam : The patient is awake and alert, oriented to place and time.   Attention span & concentration ability appears normal.  Speech is fluent,  without  dysarthria, dysphonia or aphasia.  Mood and affect are appropriate.  Cranial nerves: Pupils are equal and briskly reactive to light. Funduscopic exam  deferred. Ciliary injection - reddened facial skin.  Extraocular movements  in vertical and horizontal planes intact and without nystagmus.  Visual fields by finger perimetry are intact. Hearing to finger rub intact.   Facial sensation intact to fine touch.  Facial motor strength is symmetric and tongue and uvula move midline. Shoulder shrug was symmetrical.   Motor exam:  Normal tone, muscle bulk and symmetric strength in all extremities.  Coordination: Rapid alternating movements in the fingers/hands was normal. Mild tremor at rest and with action,  dysmetria on finger - to nose test.   Gait and station: Patient walks without assistive device and is able unassisted to climb up to the exam table. Strength within normal limits. Stance is stable and normal. Turns with 3 Steps.   Deep tendon reflexes: in the upper and lower extremities are symmetric and intact. Babinski maneuver response is downgoing.   The patient was advised of the nature of the diagnosed sleep disorder , the treatment options and risks for general a health and wellness arising from not treating the condition.  I spent more than 30 minutes of face to face time with the patient. Greater than 50% of time was spent in counseling and coordination of care. We have discussed the diagnosis and differential and I answered the patient's questions.     Assessment:  After physical and neurologic examination, review of laboratory studies,  Personal review of imaging studies, reports of other /same  Imaging studies ,  Results of polysomnography/ neurophysiology testing and pre-existing records as far as provided in visit., my assessment is     1)  Snoring and apneas were observed before  2017by the patient's former fiancee. He is  now in a new relationship and has been told the same. He uses a dental device as bruxism guard.  He has not noted hypersomnia abut is fatigued sometimes. Small oral opening with crowded teeth.  Morning headaches are now less frequent -- but headaches do not wake him up out of sleep. No need for advil.  A dry mouth is present.  2)  smoking- ( 1 ppd) , he tried chantix- failed to stop- He needs to stop, needs help to stop . Chantix offered, he stated Dr Virgina Jock has offered this , too. HTN medication has changed from monopril to amlodipine, spironolactone.    Plan:  Treatment plan and additional workup :  I like to finally go through to perform a HST or in lab PSG- risk factors for OSA are present, and he has risk factors for hypoxia too.    HST for hypoxemia in  a smoker- may contribute.  He is fully vaccinated.    Larey Seat, MD        Asencion Partridge Yuma Blucher MD  04/27/2022   CC: Shon Baton, [place],  [place] [address]

## 2022-04-27 NOTE — Patient Instructions (Signed)
Screening for Sleep Apnea  Sleep apnea is a condition in which breathing pauses or becomes shallow during sleep. Sleep apnea screening is a test to determine if you are at risk for sleep apnea. The test includes a series of questions. It will only takes a few minutes. Your health care provider may ask you to have this test in preparation for surgery or as part of a physical exam. What are the symptoms of sleep apnea? Common symptoms of sleep apnea include: Snoring. Waking up often at night. Daytime sleepiness. Pauses in breathing. Choking or gasping during sleep. Irritability. Forgetfulness. Trouble thinking clearly. Depression. Personality changes. Most people with sleep apnea do not know that they have it. What are the advantages of sleep apnea screening? Getting screened for sleep apnea can help: Ensure your safety. It is important for your health care providers to know whether or not you have sleep apnea, especially if you are having surgery or have other long-term (chronic) health conditions. Improve your health and allow you to get a better night's rest. Restful sleep can help you: Have more energy. Lose weight. Improve high blood pressure. Improve diabetes management. Prevent stroke. Prevent car accidents. What happens during the screening? Screening usually includes being asked a list of questions about your sleep quality. Some questions you may be asked include: Do you snore? Is your sleep restless? Do you have daytime sleepiness? Has a partner or spouse told you that you stop breathing during sleep? Have you had trouble concentrating or memory loss? What is your age? What is your neck circumference? To measure your neck, keep your back straight and gently wrap the tape measure around your neck. Put the tape measure at the middle of your neck, between your chin and collarbone. What is your sex assigned at birth? Do you have or are you being treated for high blood  pressure? If your screening test is positive, you are at risk for the condition. Further testing may be needed to confirm a diagnosis of sleep apnea. Where to find more information You can find screening tools online or at your health care clinic. For more information about sleep apnea screening and healthy sleep, visit these websites: Centers for Disease Control and Prevention: www.cdc.gov American Sleep Apnea Association: www.sleepapnea.org Contact a health care provider if: You think that you may have sleep apnea. Summary Sleep apnea screening can help determine if you are at risk for sleep apnea. It is important for your health care providers to know whether or not you have sleep apnea, especially if you are having surgery or have other chronic health conditions. You may be asked to take a screening test for sleep apnea in preparation for surgery or as part of a physical exam. This information is not intended to replace advice given to you by your health care provider. Make sure you discuss any questions you have with your health care provider. Document Revised: 08/29/2020 Document Reviewed: 08/29/2020 Elsevier Patient Education  2023 Elsevier Inc.  

## 2022-05-13 ENCOUNTER — Telehealth: Payer: Self-pay

## 2022-05-13 NOTE — Telephone Encounter (Signed)
LVM for pt to call back to schedule sleep study.  

## 2022-05-19 NOTE — Telephone Encounter (Signed)
LVM for pt to call back to schedule sleep study.  

## 2022-05-20 NOTE — Telephone Encounter (Signed)
Patient called back.  HST- [employer] [name]: [id number] (exp. 05/11/22 to 07/09/22)   He is scheduled at [facility] for 06/15/22 at 9:30 AM.  Mailed packet to the patient.

## 2022-05-20 NOTE — Telephone Encounter (Signed)
LVM for pt to call back to schedule.

## 2022-06-15 ENCOUNTER — Ambulatory Visit: Payer: BC Managed Care – PPO | Admitting: Neurology

## 2022-06-15 DIAGNOSIS — R0681 Apnea, not elsewhere classified: Secondary | ICD-10-CM

## 2022-06-15 DIAGNOSIS — G4763 Sleep related bruxism: Secondary | ICD-10-CM

## 2022-06-15 DIAGNOSIS — M2619 Other specified anomalies of jaw-cranial base relationship: Secondary | ICD-10-CM

## 2022-06-15 DIAGNOSIS — R0683 Snoring: Secondary | ICD-10-CM

## 2022-06-15 DIAGNOSIS — G4733 Obstructive sleep apnea (adult) (pediatric): Secondary | ICD-10-CM | POA: Diagnosis not present

## 2022-06-22 NOTE — Addendum Note (Signed)
Addended by: Larey Seat on: 06/22/2022 06:05 PM   Modules accepted: Orders

## 2022-06-22 NOTE — Procedures (Signed)
[facility] 51 year old male 08/17/1971   HOME SLEEP TEST REPORT ( by Watch PAT)   STUDY DATE:  09-15-2022   ORDERING CLINICIAN: Larey Seat, MD  REFERRING CLINICIAN: Jenny Reichmann Russo,MD    CLINICAL INFORMATION/HISTORY: Thomas Richard is a 51 y.o. male patient who had been seen here 11/2015 upon referral from Dr. Virgina Jock for a sleep consultation, and then , in 2021,  he was seen for a headache evaluation. He is an insulin dependent diabetic, has HTN, smoker. Today, 04-27-2022, again here for sleep evaluation: evaluate sleep apnea  as his girlfriend witnesses apnea episodes as well as snoring nightly. He will sleep for 7-8 hours ,wakes up 1-2 times to go to the bathroom, and is able to fall back asleep easily. He never proceeded to have a PSG, which was not covered by his insurance. His GF confirmed he is grinding his teeth. His dentist, Dr. Delman Cheadle, created a dental device for him to prevent bruxism damage to his teeth.  The patient feels this has helped his headaches , likely a TMJ component, but it has not affected snoring or possible apnea.    Epworth sleepiness score: 1/24.   BMI: 28.7 kg/m   Neck Circumference: 17"   FINDINGS:   Sleep Summary:   Total Recording Time (hours, min):     9 hours and 14 minutes   Total Sleep Time (hours, min):   8 hours and 14 minutes              Percent REM (%):     23.2%                                   Respiratory Indices:   Calculated pAHI (per hour): 56.1/h                           REM pAHI:   26.7/h                                              NREM pAHI:     65.3/h      Most desaturations were more than 4% lower than the mean oxygen saturation.                     Supine AHI:    The patient slept 256 minutes in supine position with an AHI of 87/h. He also slept 125 minutes on his left side with an AHI of 21.7/h 112 minutes on his right with an AHI of 18.2/h. Snoring data show a mean volume of 42 dB present for 36% of  the total sleep time and reaching intermittently over 60 dB.                                               Oxygen Saturation Statistics:     O2 Saturation Range (%):      Between a nadir of 69 and a maximum saturation at 99% with a total of 81 desaturations during this recording.  O2 Saturation (minutes) <89%:     118.2 minutes, 24% of total sleep time.      Pulse Rate Statistics:   Pulse Mean (bpm):   Mean heart rate was 82 bpm              Pulse Range:   Pulse range was between 53 bpm and 114 bpm.              IMPRESSION:  This HST confirms the presence of severe sleep apnea more frequently seen in non-REM sleep which could indicate a central apnea component.  Further of concern is the extreme duration of hypoxia for about a quarter of the total sleep time.  The heart rate variability was in normal rate, but this home sleep test does not give [place] data about cardiac rhythm.   RECOMMENDATION: If in any way possible I would ask Thomas Richard to return for an in lab titration.  We are concerned that we miss central apneas on home sleep test .  The degree of hypoxemia is extremely concerning and oxygen could only be titrated in an in lab sleep study.  The patient may need to have a titration to positive airway pressure as well as oxygen.    So I will order the titration study if his insurance will not permit that I will order an autotitration CPAP device to initiate treatment, but he would have to follow-up with an overnight pulse oximetry on CPAP.Marland Kitchen    INTERPRETING PHYSICIAN:   Larey Seat, MD   Medical Director of [facility] at [facility].

## 2022-06-23 ENCOUNTER — Telehealth: Payer: Self-pay | Admitting: *Deleted

## 2022-06-23 NOTE — Telephone Encounter (Signed)
-----   Message from Larey Seat, MD sent at 06/22/2022  6:05 PM EDT ----- [facility] 51 year old male [birth year]  HOME SLEEP TEST REPORT ( by Watch PAT)  STUDY DATE: 09-15-2022   Percent REM (%): 23.2% Calculated pAHI (per hour): 56.1/h  REM pAHI: 26.7/h NREM pAHI:65.3/h    Supine AHI: The patient slept 256 minutes in supine position with an AHI of 87/h. He also slept 125 minutes on his left side with an AHI of 21.7/h 112 minutes on his right with an AHI of 18.2/h. Snoring data show a mean volume of 42 dB present for 36% of the total sleep time and reaching intermittently over 60 dB.  Oxygen Saturation Statistics:  O2 Saturation Range (%): Between a nadir of 69% and a maximum saturation at 99% with a total of 81 desaturations during this recording. O2 Saturation (minutes) <89%:118.2 minutes, 24% of total sleep time.  IMPRESSION:  This HST confirms the presence of severe sleep apnea (more frequently seen in non-REM sleep which could indicate a central apnea component).  Further of concern is the extreme duration of hypoxia for about a quarter of the total sleep time.   The heart rate variability was in normal rate, but this home sleep test does not give [place] data about cardiac rhythm.  RECOMMENDATION: If in any way possible I would ask Thomas Richard to return for an in lab titration.  We are concerned that we miss central apneas on home sleep tests.  The degree of hypoxemia is extremely concerning and oxygen could only be titrated in an in lab sleep study.  The patient may need to have a titration to positive airway pressure as well as oxygen.    So I will order the titration study if his insurance will not permit that I will order an  autotitration CPAP device to initiate treatment, but he would have to follow-up with an overnight pulse oximetry on CPAP.Marland Kitchen   INTERPRETING PHYSICIAN:   Larey Seat, MD   Medical Director of [facility] at [facility].

## 2022-06-23 NOTE — Telephone Encounter (Signed)
LVM for pt about results per Dr. Edwena Felty note. Advised sleep lab will reach out to schedule one they have this approved through his insurance. Asked him to call back or send mychart message if he has any additional questions.

## 2022-06-28 ENCOUNTER — Encounter: Payer: Self-pay | Admitting: Neurology

## 2022-06-28 NOTE — Telephone Encounter (Signed)
Called the patient back. There was no answer. LVM for the patient to call back. Advised that I will also send a mychart message if easier to review that way.

## 2022-07-01 ENCOUNTER — Other Ambulatory Visit: Payer: Self-pay | Admitting: *Deleted

## 2022-07-01 DIAGNOSIS — G4733 Obstructive sleep apnea (adult) (pediatric): Secondary | ICD-10-CM

## 2022-07-01 NOTE — Telephone Encounter (Signed)
Patient left a voicemail about his sleep results.

## 2022-07-01 NOTE — Telephone Encounter (Signed)
Pt has just returned the call to [place], [place]

## 2022-07-01 NOTE — Telephone Encounter (Addendum)
Called and spoke w/ pt about results. He verbalized understanding. He will keep an eye out from sleep lab to schedule once they get approval for CPAP titration.   After hanging up with pt, I realized there was a note that his insurance denied CPAP Titration study and we would need to go ahead and get him set up with Auto cpap at this point. Asked him to call back to discuss.

## 2022-07-01 NOTE — Telephone Encounter (Signed)
Called pt back. He is agreeable to start on autopap. I advised pt that an order will be sent to a DME, [facility], and [facility] will call the pt within about one week after they file with the pt's insurance. [facility] will show the pt how to use the machine, fit for masks, and troubleshoot the CPAP if needed. A follow up appt was made for insurance purposes with Amy Lomax,NP on 10/06/22 at [place] (pt wanted to avoid Christmas timeframe since he will be out of town, also preferred to come in/not do VV). Pt verbalized understanding to arrive 15 minutes early and bring their CPAP. I sent mychart message with all this information per pt request (preferred this over letter) Pt verbalized understanding of results. Pt had no questions at this time but was encouraged to call back if questions arise. I have sent the order to [facility] and have received confirmation that they have received the order.

## 2022-07-07 ENCOUNTER — Ambulatory Visit: Payer: BC Managed Care – PPO | Admitting: Orthopaedic Surgery

## 2022-07-07 ENCOUNTER — Ambulatory Visit (INDEPENDENT_AMBULATORY_CARE_PROVIDER_SITE_OTHER): Payer: BC Managed Care – PPO

## 2022-07-07 DIAGNOSIS — M25512 Pain in left shoulder: Secondary | ICD-10-CM

## 2022-07-07 MED ORDER — BUPIVACAINE HCL 0.5 % IJ SOLN
3.0000 mL | INTRAMUSCULAR | Status: AC | PRN
Start: 2022-07-07 — End: 2022-07-07
  Administered 2022-07-07: 3 mL via INTRA_ARTICULAR

## 2022-07-07 MED ORDER — METHYLPREDNISOLONE ACETATE 40 MG/ML IJ SUSP
40.0000 mg | INTRAMUSCULAR | Status: AC | PRN
  Administered 2022-07-07: 40 mg via INTRA_ARTICULAR

## 2022-07-07 MED ORDER — LIDOCAINE HCL 1 % IJ SOLN
3.0000 mL | INTRAMUSCULAR | Status: AC | PRN
  Administered 2022-07-07: 3 mL

## 2022-07-07 NOTE — Progress Notes (Signed)
Office Visit Note   Patient: Thomas Richard           Date of Birth: December 28, 1970           MRN: [id number] Visit Date: 07/07/2022              Requested by: Shon Baton, [address] [place],  [place] [address] PCP: Shon Baton, MD   Assessment & Plan: Visit Diagnoses:  1. Acute pain of left shoulder     Plan: Impression is acute left shoulder pain.  Sounds like he overdid it with activity.  Treatment options explained and we proceeded with a subacromial injection today.  He will also increase his dosage of ibuprofen.  Rest and activity modification.  Follow-up as needed.  Follow-Up Instructions: No follow-ups on file.   Orders:  Orders Placed This Encounter  Procedures   Large Joint Inj   XR Shoulder Left   No orders of the defined types were placed in this encounter.     Procedures: Large Joint Inj: L subacromial bursa on 07/07/2022 9:35 AM Indications: pain Details: 22 G needle  Arthrogram: No  Medications: 3 mL lidocaine 1 %; 3 mL bupivacaine 0.5 %; 40 mg methylPREDNISolone acetate 40 MG/ML Outcome: tolerated well, no immediate complications Patient was prepped and draped in the usual sterile fashion.       Clinical Data: No additional findings.   Subjective: Chief Complaint  Patient presents with   Left Shoulder - Pain    HPI Patient comes in today for evaluation of cute onset of left shoulder pain about 5 days ago.  Was working outside and woke up the next day with's severe pain in the left shoulder.  Advil is only helping partially.  Denies any injuries.  Denies any radicular symptoms. Review of Systems  Constitutional: Negative.   All other systems reviewed and are negative.    Objective: Vital Signs: There were no vitals taken for this visit.  Physical Exam Vitals and nursing note reviewed.  Constitutional:      Appearance: He is well-developed.  HENT:     Head: Normocephalic and atraumatic.  Eyes:     Pupils: Pupils are equal,  round, and reactive to light.  Pulmonary:     Effort: Pulmonary effort is normal.  Abdominal:     Palpations: Abdomen is soft.  Musculoskeletal:        General: Normal range of motion.     Cervical back: Neck supple.  Skin:    General: Skin is warm.  Neurological:     Mental Status: He is alert and oriented to person, place, and time.  Psychiatric:        Behavior: Behavior normal.        Thought Content: Thought content normal.        Judgment: Judgment normal.     Ortho Exam Examination of left shoulder shows guarding to passive range of motion which is normal.  Manual muscle testing the rotator cuff is grossly normal.  Pain with impingement testing. Specialty Comments:  No specialty comments available.  Imaging: XR Shoulder Left  Result Date: 07/07/2022 Mild to moderate degenerative changes of the [facility] joint.  No acute abnormalities.  Glenohumeral joint appears to be well preserved.  Mildly downward sloping acromion.    PMFS History: Patient Active Problem List   Diagnosis Date Noted   Acute pain of left shoulder 07/07/2022   Bruxism, sleep-related 04/27/2022   Snoring 04/27/2022   Prognathia 04/27/2022   Witnessed  episode of apnea 04/27/2022   Tobacco dependence with current use 04/27/2022   Colon cancer screening 08/14/2021   Insulin pump in place 08/14/2021   Lateral epicondylitis, right elbow 08/13/2020   Analgesic overuse headache 02/07/2020   Chronic left-sided headaches 02/07/2020   Cigarette smoker 02/07/2020   Insulin pump status 02/07/2020   Chronic pain of left knee 02/20/2019   Past Medical History:  Diagnosis Date   Anxiety    Diabetes mellitus without complication ([place])    ED (erectile dysfunction)    Hyperlipidemia    Hypertension    Hypothyroid    Insomnia    Prostatitis    Smoker     Family History  Problem Relation Age of Onset   Depression Mother    Lung disease Father    Diabetes Father    Hypertension Father    Colon cancer Neg  Hx    Esophageal cancer Neg Hx    Rectal cancer Neg Hx    Inflammatory bowel disease Neg Hx    Liver disease Neg Hx    Pancreatic cancer Neg Hx    Stomach cancer Neg Hx     Past Surgical History:  Procedure Laterality Date   WISDOM TOOTH EXTRACTION  1996   Social History   Occupational History   Not on file  Tobacco Use   Smoking status: Every Day    Packs/day: 1.00    Years: 10.00    Total pack years: 10.00    Types: Cigarettes   Smokeless tobacco: Never  Vaping Use   Vaping Use: Never used  Substance and Sexual Activity   Alcohol use: Yes    Alcohol/week: 0.0 standard drinks of alcohol    Comment: 2 beers per dasy   Drug use: No   Sexual activity: Not on file

## 2022-07-27 ENCOUNTER — Ambulatory Visit (INDEPENDENT_AMBULATORY_CARE_PROVIDER_SITE_OTHER): Payer: BC Managed Care – PPO | Admitting: Orthopaedic Surgery

## 2022-07-27 ENCOUNTER — Encounter: Payer: Self-pay | Admitting: Orthopaedic Surgery

## 2022-07-27 DIAGNOSIS — M25562 Pain in left knee: Secondary | ICD-10-CM

## 2022-07-27 MED ORDER — METHYLPREDNISOLONE ACETATE 40 MG/ML IJ SUSP
40.0000 mg | INTRAMUSCULAR | Status: AC | PRN
  Administered 2022-07-27: 40 mg via INTRA_ARTICULAR

## 2022-07-27 MED ORDER — LIDOCAINE HCL 1 % IJ SOLN
2.0000 mL | INTRAMUSCULAR | Status: AC | PRN
Start: 2022-07-27 — End: 2022-07-27
  Administered 2022-07-27: 2 mL

## 2022-07-27 MED ORDER — BUPIVACAINE HCL 0.5 % IJ SOLN
2.0000 mL | INTRAMUSCULAR | Status: AC | PRN
  Administered 2022-07-27: 2 mL via INTRA_ARTICULAR

## 2022-07-27 NOTE — Progress Notes (Signed)
Office Visit Note   Patient: Thomas Richard           Date of Birth: Feb 19, 1971           MRN: [id number] Visit Date: 07/27/2022              Requested by: Thomas Richard, [address],  [place] [address] PCP: Thomas Baton, MD   Assessment & Plan: Visit Diagnoses:  1. Acute pain of left knee     Plan: Impression is acute left knee pain.  Unclear as to what the pop represented.  He wanted to try cortisone injection as he has had good relief from these in the past.  He plans to travel to [place] next week.  He tolerated the injection well.  Follow-Up Instructions: No follow-ups on file.   Orders:  No orders of the defined types were placed in this encounter.  No orders of the defined types were placed in this encounter.     Procedures: Large Joint Inj: L knee on 07/27/2022 3:41 PM Details: 22 G needle Medications: 2 mL bupivacaine 0.5 %; 2 mL lidocaine 1 %; 40 mg methylPREDNISolone acetate 40 MG/ML Outcome: tolerated well, no immediate complications Patient was prepped and draped in the usual sterile fashion.       Clinical Data: No additional findings.   Subjective: Chief Complaint  Patient presents with   Left Knee - Pain    HPI Thomas Richard returns today for acute left knee pain.  He squatted down last night pet his dog and felt a pop and then felt locking in his knee.  He has been wearing a compression sleeve.  Has had pain when weightbearing. Review of Systems  Constitutional: Negative.   All other systems reviewed and are negative.    Objective: Vital Signs: There were no vitals taken for this visit.  Physical Exam Vitals and nursing note reviewed.  Constitutional:      Appearance: He is well-developed.  HENT:     Head: Normocephalic and atraumatic.  Eyes:     Pupils: Pupils are equal, round, and reactive to light.  Pulmonary:     Effort: Pulmonary effort is normal.  Abdominal:     Palpations: Abdomen is soft.  Musculoskeletal:        General:  Normal range of motion.     Cervical back: Neck supple.  Skin:    General: Skin is warm.  Neurological:     Mental Status: He is alert and oriented to person, place, and time.  Psychiatric:        Behavior: Behavior normal.        Thought Content: Thought content normal.        Judgment: Judgment normal.     Ortho Exam Examination of the left knee shows no joint effusion.  No real joint line tenderness.  Collaterals and cruciates are stable.  He guards to deep flexion. Specialty Comments:  No specialty comments available.  Imaging: No results found.   PMFS History: Patient Active Problem List   Diagnosis Date Noted   Acute pain of left knee 07/27/2022   Acute pain of left shoulder 07/07/2022   Bruxism, sleep-related 04/27/2022   Snoring 04/27/2022   Prognathia 04/27/2022   Witnessed episode of apnea 04/27/2022   Tobacco dependence with current use 04/27/2022   Colon cancer screening 08/14/2021   Insulin pump in place 08/14/2021   Lateral epicondylitis, right elbow 08/13/2020   Analgesic overuse headache 02/07/2020   Chronic left-sided  headaches 02/07/2020   Cigarette smoker 02/07/2020   Insulin pump status 02/07/2020   Chronic pain of left knee 02/20/2019   Past Medical History:  Diagnosis Date   Anxiety    Diabetes mellitus without complication ([place])    ED (erectile dysfunction)    Hyperlipidemia    Hypertension    Hypothyroid    Insomnia    Prostatitis    Smoker     Family History  Problem Relation Age of Onset   Depression Mother    Lung disease Father    Diabetes Father    Hypertension Father    Colon cancer Neg Hx    Esophageal cancer Neg Hx    Rectal cancer Neg Hx    Inflammatory bowel disease Neg Hx    Liver disease Neg Hx    Pancreatic cancer Neg Hx    Stomach cancer Neg Hx     Past Surgical History:  Procedure Laterality Date   WISDOM TOOTH EXTRACTION  1996   Social History   Occupational History   Not on file  Tobacco Use   Smoking  status: Every Day    Packs/day: 1.00    Years: 10.00    Total pack years: 10.00    Types: Cigarettes   Smokeless tobacco: Never  Vaping Use   Vaping Use: Never used  Substance and Sexual Activity   Alcohol use: Yes    Alcohol/week: 0.0 standard drinks of alcohol    Comment: 2 beers per dasy   Drug use: No   Sexual activity: Not on file

## 2022-08-02 ENCOUNTER — Encounter: Payer: Self-pay | Admitting: Orthopaedic Surgery

## 2022-08-12 ENCOUNTER — Other Ambulatory Visit: Payer: Self-pay

## 2022-08-12 DIAGNOSIS — M25562 Pain in left knee: Secondary | ICD-10-CM

## 2022-09-01 ENCOUNTER — Ambulatory Visit
Admission: RE | Admit: 2022-09-01 | Discharge: 2022-09-01 | Disposition: A | Discharge status: Home / Self Care | Payer: BC Managed Care – PPO | Source: Ambulatory Visit | Attending: Orthopaedic Surgery | Admitting: Orthopaedic Surgery

## 2022-09-01 DIAGNOSIS — M25562 Pain in left knee: Secondary | ICD-10-CM

## 2022-09-06 ENCOUNTER — Encounter: Payer: Self-pay | Admitting: Family Medicine

## 2022-09-06 ENCOUNTER — Ambulatory Visit (INDEPENDENT_AMBULATORY_CARE_PROVIDER_SITE_OTHER): Payer: BC Managed Care – PPO | Admitting: Family Medicine

## 2022-09-06 VITALS — BP 119/76 | HR 98 | Ht 72.0 in | Wt 220.0 lb

## 2022-09-06 DIAGNOSIS — G4733 Obstructive sleep apnea (adult) (pediatric): Secondary | ICD-10-CM

## 2022-09-06 NOTE — Patient Instructions (Addendum)
Please continue using your CPAP regularly. While your insurance requires that you use CPAP at least 4 hours each night on 70% of the nights, I recommend, that you not skip any nights and use it throughout the night if you can. Getting used to CPAP and staying with the treatment long term does take time and patience and discipline. Untreated obstructive sleep apnea when it is moderate to severe can have an adverse impact on cardiovascular health and raise her risk for heart disease, arrhythmias, hypertension, congestive heart failure, stroke and diabetes. Untreated obstructive sleep apnea causes sleep disruption, nonrestorative sleep, and sleep deprivation. This can have an impact on your day to day functioning and cause daytime sleepiness and impairment of cognitive function, memory loss, mood disturbance, and problems focussing. Using CPAP regularly can improve these symptoms.  Keep an eye on the air leak at home. We can send you for a mask refitting if needed.   Follow up in 1 year

## 2022-09-06 NOTE — Progress Notes (Signed)
PATIENT: Thomas Richard DOB: 1971-02-14  REASON FOR VISIT: follow up HISTORY FROM: patient  Chief Complaint  Patient presents with   Follow-up    Pt in room #1 and alone. Pt here today for f/u on her OSA on CPAP.     HISTORY OF PRESENT ILLNESS:  09/06/22 ALL:  Thomas Richard is a 51 y.o. male here today for follow up for OSA on CPAP.  He was seen in consult with Dr Brett Fairy 04/2022 for snoring and witnessed apneic events. HST 06/2022 showed sever OSA with AHI 56.1/hr, REM AHI 26.7/h and O2 nadir 69%. AutoPAP ordered. Since, he is doing well. He is adjusting to therapy without difficulty. He has not noted any particular improvement in sleep. He is using a FFM. Nasal pillow did not work for him. He does note a leak at home. AHI is well managed.     HISTORY: (copied from Dr Dohmeier's previous note)  HPI:  Thomas Richard is a 51 y.o. male , has been seen here 11/2015 upon referral from Dr. Virgina Jock for a sleep consultation, was then , in 2021,  seen for a headache evaluation.  Today, 04-27-2022 again for sleep evaluation: Presents today to evaluate sleep apnea a concern. His GF witnesses apnea episodes as well as snoring. He avg 7/8 hrs of sleep and wakes up 1-2 times to go to the bathroom able to fall back asleep easily. He never proceeded to have  a PSG, which was not covered by his insurance.His GF confirmed he is having apneas, he is grinding his teeth. His dentist Dr. Delman Cheadle created created a dental device for him to prevent bruxism marks.  The patient feels this has helped his headaches TMJ but it has not affected snoring or possible apnea. He is here today to address this.  When I saw the patient in the past he was neither excessively fatigued nor daytime sleepy and this is true today again his Epworth Sleepiness Scale is endorsed at 1 out of 24 points his fatigue severity score was 17 out of 63 points. He is still a smoker , 30 cigarettes a day. Also has been vaping to cut down on cig.   He is  drinking beer 2-3 a week. He drinks miller light - low carbs ,is a diabetic.  Thomas Richard dentist has now asked him if he has bruxism, and was concerned about OSA as well.    The current comorbidities of hypertension and diabetes were endorsed in 2017. The patient remains an active tobacco user but has not been diagnosed with any primary pulmonary diseases. He is smoking tobacco he has kept his weight steady but his diabetes was not always well controlled. 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 4 times a week.    REVIEW OF SYSTEMS: Out of a complete 14 system review of symptoms, the patient complains only of the following symptoms, none and all other reviewed systems are negative.  ESS: 0/24, previously 1/24  ALLERGIES: No Known Allergies  HOME MEDICATIONS: Outpatient Medications Prior to Visit  Medication Sig Dispense Refill   ALPRAZolam (XANAX) 1 MG tablet Take 1 mg by mouth at bedtime as needed for anxiety.     amLODipine (NORVASC) 10 MG tablet Take 10 mg by mouth daily.     aspirin 81 MG tablet Take 81 mg by mouth daily.     atorvastatin (LIPITOR) 40 MG tablet Take 40 mg by mouth  daily.     Continuous Blood Gluc Sensor (FREESTYLE LIBRE 14 DAY SENSOR) MISC SMARTSIG:1 Each Topical Every 2 Weeks     escitalopram (LEXAPRO) 10 MG tablet Take 10 mg by mouth daily.     insulin lispro (HUMALOG) 100 UNIT/ML injection Inject into the skin See admin instructions. Via Insulin Pump     levothyroxine (SYNTHROID, LEVOTHROID) 75 MCG tablet Take 75-150 mcg by mouth daily before breakfast. 150 mcg on Sat, and 75 mcg all other days     loratadine (CLARITIN) 10 MG tablet Take 10 mg by mouth daily.     Multiple Vitamin (MULTIVITAMIN) tablet Take 1 tablet by mouth daily.     Multiple Vitamins-Minerals (MULTIVITAMIN WITH MINERALS) tablet Take 1 tablet by mouth daily.     olmesartan (BENICAR) 40 MG tablet Take 40 mg by mouth daily.      spironolactone (ALDACTONE) 25 MG tablet Take 25 mg by mouth daily.     No facility-administered medications prior to visit.    PAST MEDICAL HISTORY: Past Medical History:  Diagnosis Date   Anxiety    Diabetes mellitus without complication ([place])    ED (erectile dysfunction)    Hyperlipidemia    Hypertension    Hypothyroid    Insomnia    Prostatitis    Smoker     PAST SURGICAL HISTORY: Past Surgical History:  Procedure Laterality Date   WISDOM TOOTH EXTRACTION  1996    FAMILY HISTORY: Family History  Problem Relation Age of Onset   Depression Mother    Lung disease Father    Diabetes Father    Hypertension Father    Colon cancer Neg Hx    Esophageal cancer Neg Hx    Rectal cancer Neg Hx    Inflammatory bowel disease Neg Hx    Liver disease Neg Hx    Pancreatic cancer Neg Hx    Stomach cancer Neg Hx     SOCIAL HISTORY: Social History   Socioeconomic History   Marital status: Single    Spouse name: Not on file   Number of children: 0   Years of education: Not on file   Highest education level: Not on file  Occupational History   Not on file  Tobacco Use   Smoking status: Every Day    Packs/day: 1.00    Years: 10.00    Total pack years: 10.00    Types: Cigarettes   Smokeless tobacco: Never  Vaping Use   Vaping Use: Never used  Substance and Sexual Activity   Alcohol use: Yes    Alcohol/week: 0.0 standard drinks of alcohol    Comment: 2 beers per dasy   Drug use: No   Sexual activity: Not on file  Other Topics Concern   Not on file  Social History Narrative   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   Social Determinants of Health   Financial Resource Strain: Not on file  Food Insecurity: Not on file  Transportation Needs: Not on file  Physical Activity: Not on file  Stress: Not on file  Social Connections: Not on file  Intimate Partner Violence: Not on file     PHYSICAL EXAM  Vitals:   09/06/22  1045  BP: 119/76  Pulse: 98  Weight: 220 lb (99.8 kg)  Height: 6' (1.829 m)   Body mass index is 29.84 kg/m.  Generalized: Well developed, in no acute distress  Cardiology: normal rate and rhythm,  no murmur noted Respiratory: clear to auscultation bilaterally  Neurological examination  Mentation: Alert oriented to time, place, history taking. Follows all commands speech and language fluent Cranial nerve II-XII: Pupils were equal round reactive to light. Extraocular movements were full, visual field were full  Motor: The motor testing reveals 5 over 5 strength of all 4 extremities. Good symmetric motor tone is noted throughout.  Gait and station: Gait is normal.    DIAGNOSTIC DATA (LABS, IMAGING, TESTING) - I reviewed patient records, labs, notes, testing and imaging myself where available.      No data to display           Lab Results  Component Value Date   WBC 9.2 06/30/2021   HGB 14.9 06/30/2021   HCT 42.8 06/30/2021   MCV 95.7 06/30/2021   PLT 294 06/30/2021   No results found for: "NA", "K", "CL", "CO2", "GLUCOSE", "BUN", "CREATININE", "CALCIUM", "PROT", "ALBUMIN", "AST", "ALT", "ALKPHOS", "BILITOT", "GFRNONAA", "GFRAA" No results found for: "CHOL", "HDL", "LDLCALC", "LDLDIRECT", "TRIG", "CHOLHDL" No results found for: "HGBA1C" No results found for: "VITAMINB12" No results found for: "TSH"   ASSESSMENT AND PLAN 51 y.o. year old male  has a past medical history of Anxiety, Diabetes mellitus without complication ([place]), ED (erectile dysfunction), Hyperlipidemia, Hypertension, Hypothyroid, Insomnia, Prostatitis, and Smoker. here with     ICD-10-CM   1. OSA on CPAP  G47.33         Frisco Cordts is doing well on CPAP therapy. Compliance report reveals excellent compliance. I have advised he monitor for leak at home. May consider mask refitting if needed. AHI is well managed. Facial hair most likely contributes. He was encouraged to continue using CPAP nightly and for  greater than 4 hours each night. We will update supply orders as indicated. Risks of untreated sleep apnea review and education materials provided. Healthy lifestyle habits encouraged. He will follow up in 1 year, sooner if needed. He verbalizes understanding and agreement with this plan.    No orders of the defined types were placed in this encounter.    No orders of the defined types were placed in this encounter.     Debbora Presto, FNP-C 09/06/2022, 11:33 AM [facility] [address] [place], [place] [address] [phone]

## 2022-09-10 ENCOUNTER — Ambulatory Visit: Payer: BC Managed Care – PPO | Admitting: Orthopaedic Surgery

## 2022-10-06 ENCOUNTER — Ambulatory Visit: Payer: BC Managed Care – PPO | Admitting: Family Medicine

## 2022-10-22 ENCOUNTER — Encounter: Payer: Self-pay | Admitting: Orthopaedic Surgery

## 2022-10-25 ENCOUNTER — Other Ambulatory Visit: Payer: Self-pay

## 2022-10-25 DIAGNOSIS — M25522 Pain in left elbow: Secondary | ICD-10-CM

## 2022-10-25 NOTE — Therapy (Signed)
OUTPATIENT PHYSICAL THERAPY UPPER EXTREMITY EVALUATION   Patient Name: Thomas Richard MRN: [id number] DOB:09-Mar-1971, 52 y.o., male Today's Date: 10/26/2022  END OF SESSION:  PT End of Session - 10/26/22 1015     Visit Number 1    Number of Visits 12    Date for PT Re-Evaluation 12/07/22    PT Start Time 0930    PT Stop Time 1005    PT Time Calculation (min) 35 min    Activity Tolerance Patient tolerated treatment well    Behavior During Therapy [facility] for tasks assessed/performed             Past Medical History:  Diagnosis Date   Anxiety    Diabetes mellitus without complication ([place])    ED (erectile dysfunction)    Hyperlipidemia    Hypertension    Hypothyroid    Insomnia    Prostatitis    Smoker    Past Surgical History:  Procedure Laterality Date   [place]   Patient Active Problem List   Diagnosis Date Noted   Acute pain of left knee 07/27/2022   Acute pain of left shoulder 07/07/2022   Bruxism, sleep-related 04/27/2022   Snoring 04/27/2022   Prognathia 04/27/2022   Witnessed episode of apnea 04/27/2022   Tobacco dependence with current use 04/27/2022   Colon cancer screening 08/14/2021   Insulin pump in place 08/14/2021   Lateral epicondylitis, right elbow 08/13/2020   Analgesic overuse headache 02/07/2020   Chronic left-sided headaches 02/07/2020   Cigarette smoker 02/07/2020   Insulin pump status 02/07/2020   Chronic pain of left knee 02/20/2019    PCP: Shon Baton, MD  REFERRING PROVIDER: Leandrew Koyanagi, MD  REFERRING DIAG: [phone] (ICD-10-CM) - Pain in left elbow  THERAPY DIAG:  Pain in left elbow - Plan: PT plan of care cert/re-cert  Muscle weakness (generalized) - Plan: PT plan of care cert/re-cert  Rationale for Evaluation and Treatment: Rehabilitation  ONSET DATE: 3 weeks ago  SUBJECTIVE:                                                                                                                                                                                       SUBJECTIVE STATEMENT: Reports Lt elbow pain about 3 weeks ago without known injury.  History of Rt lateral epicondylitis which improved with dry needling.  PERTINENT HISTORY: Rt tennis elbow, anxiety, DM, HTN  PAIN:  Are you having pain? Yes: NPRS scale: 0 currently, up to 7-8/10 Pain location: Lt elbow Pain description: sharp Aggravating factors: lifting/pulling that involves wrist extension Relieving factors: avoiding provoking positions  PRECAUTIONS: None  WEIGHT BEARING RESTRICTIONS:  No  FALLS:  Has patient fallen in last 6 months? 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  PLOF: Independent and Leisure: golfing, walking the dog  PATIENT GOALS: improve pain  NEXT MD VISIT: PRN  OBJECTIVE:   PATIENT SURVEYS :  10/25/22: FOTO 51 (predicted 49)  COGNITION: Overall cognitive status: Within functional limits for tasks assessed  UPPER EXTREMITY ROM: WNL bil  UPPER EXTREMITY MMT:  MMT Right eval Left eval  Grip Strength 88.3# 33.2# After DN (36.3#)  Wrist flexion 5/5 5/5  Wrist extension 5/5 3/5 with pain  Wrist pronation 5/5 5/5 with pain  Wrist supination 5/5 5/5   (Blank rows = not tested)   PALPATION:  10/26/22: trigger points noted in Lt wrist extensors   TODAY'S TREATMENT:                                                                                                                                         DATE:  10/25/22 TherEx See HEP - demonstrated trial reps with cues needed for comprehension  Trigger Point Dry-Needling  Treatment instructions: Expect mild to moderate muscle soreness. S/S of pneumothorax if dry needled over a lung field, and to seek immediate medical attention should they occur. Patient verbalized understanding of these instructions and  education.  Patient Consent Given: Yes Education handout provided: Yes Muscles treated: Lt wrist extensors Electrical stimulation performed: Yes Parameters:  10 mA freq, intensity to tolerance x 5 min Treatment response/outcome: twitch responses, decreased pain, slightly increased grip strength   PATIENT EDUCATION: Education details: HEP, DN Person educated: Patient Education method: Explanation, Demonstration, and Handouts Education comprehension: verbalized understanding, returned demonstration, and needs further education  HOME EXERCISE PROGRAM: Access Code: [email or web address] URL: [email or web address] Date: 10/26/2022 Prepared by: Faustino Congress  Exercises - Standing Wrist Flexion Stretch  - 2 x daily - 7 x weekly - 1 sets - 3 reps - 30 sec hold - Towel Roll Squeeze  - 3 x daily - 7 x weekly - 1 sets - 15 reps - 5 sec hold - Seated Wrist Extension with Dumbbell  - 1 x daily - 7 x weekly - 3 sets - 10 reps  Patient Education - Trigger Point Dry Needling  ASSESSMENT:  CLINICAL IMPRESSION: Patient is a 52 y.o. male who was seen today for physical therapy evaluation and treatment for Lt elbow pain consistent with Lt lateral episondylitis. He demonstrates decreased strength and pain affecting functional activities.  He will benefit from PT to address deficits listed.   OBJECTIVE IMPAIRMENTS: decreased strength, increased fascial restrictions, increased muscle spasms, impaired UE functional use, and pain.   ACTIVITY LIMITATIONS: carrying, lifting, dressing, and hygiene/grooming  PARTICIPATION LIMITATIONS: cleaning, driving, shopping, community activity, and yard work  PERSONAL FACTORS: Past/current experiences and 3+ comorbidities: Rt tennis  elbow, anxiety, DM, HTN  are also affecting patient's functional outcome.   REHAB POTENTIAL: Good  CLINICAL DECISION MAKING: Stable/uncomplicated  EVALUATION COMPLEXITY: Low  GOALS: Goals reviewed with patient?  Yes  SHORT TERM GOALS: Target date: 11/16/2022  Independent with initial HEP Goal status: INITIAL   LONG TERM GOALS: Target date: 12/07/2022  Independent with final HEP Goal status: INITIAL  2.  FOTO score improved to 69 Goal status: INITIAL  3.  Lt grip strength improved to at least 50# for improved strength Goal status: INIITAL  4.  Report pain < 3/10 with lifting activities for improved function Goal status: INITIAL   PLAN: PT FREQUENCY: 1-2x/week  PT DURATION: 6 weeks  PLANNED INTERVENTIONS: Therapeutic exercises, Therapeutic activity, Neuromuscular re-education, Patient/Family education, Self Care, Joint mobilization, Aquatic Therapy, Dry Needling, Electrical stimulation, Cryotherapy, Moist heat, Splintting, Taping, Ultrasound, Ionotophoresis '4mg'$ /ml Dexamethasone, Manual therapy, and Re-evaluation  PLAN FOR NEXT SESSION: assess response to DN and repeat as indicated, HEP progression PRN   Laureen Abrahams, PT, DPT 10/26/22 10:18 AM

## 2022-10-26 ENCOUNTER — Ambulatory Visit (INDEPENDENT_AMBULATORY_CARE_PROVIDER_SITE_OTHER): Payer: 59 | Admitting: Physical Therapy

## 2022-10-26 ENCOUNTER — Encounter: Payer: Self-pay | Admitting: Physical Therapy

## 2022-10-26 ENCOUNTER — Other Ambulatory Visit: Payer: Self-pay

## 2022-10-26 DIAGNOSIS — M6281 Muscle weakness (generalized): Secondary | ICD-10-CM

## 2022-10-26 DIAGNOSIS — M25522 Pain in left elbow: Secondary | ICD-10-CM

## 2022-10-29 NOTE — Therapy (Signed)
OUTPATIENT PHYSICAL THERAPY TREATMENT NOTE   Patient Name: Thomas Richard MRN: [id number] DOB:11-25-1970, 52 y.o., male Today's Date: 11/01/2022  END OF SESSION:   PT End of Session - 11/01/22 0805     Visit Number 2    Number of Visits 12    Date for PT Re-Evaluation 12/07/22    PT Start Time 0801    PT Stop Time 0826    PT Time Calculation (min) 25 min    Activity Tolerance Patient tolerated treatment well    Behavior During Therapy [facility] for tasks assessed/performed             Past Medical History:  Diagnosis Date   Anxiety    Diabetes mellitus without complication ([place])    ED (erectile dysfunction)    Hyperlipidemia    Hypertension    Hypothyroid    Insomnia    Prostatitis    Smoker    Past Surgical History:  Procedure Laterality Date   [place]   Patient Active Problem List   Diagnosis Date Noted   Acute pain of left knee 07/27/2022   Acute pain of left shoulder 07/07/2022   Bruxism, sleep-related 04/27/2022   Snoring 04/27/2022   Prognathia 04/27/2022   Witnessed episode of apnea 04/27/2022   Tobacco dependence with current use 04/27/2022   Colon cancer screening 08/14/2021   Insulin pump in place 08/14/2021   Lateral epicondylitis, right elbow 08/13/2020   Analgesic overuse headache 02/07/2020   Chronic left-sided headaches 02/07/2020   Cigarette smoker 02/07/2020   Insulin pump status 02/07/2020   Chronic pain of left knee 02/20/2019     THERAPY DIAG:  Pain in left elbow  Muscle weakness (generalized)   PCP: Shon Baton, MD  REFERRING PROVIDER: Leandrew Koyanagi, MD  REFERRING DIAG: [phone] (ICD-10-CM) - Pain in left elbow  EVAL THERAPY DIAG:  Pain in left elbow - Plan: PT plan of care cert/re-cert  Muscle weakness (generalized) - Plan: PT plan of care cert/re-cert  Rationale for Evaluation and Treatment: Rehabilitation  ONSET DATE: 3 weeks ago  SUBJECTIVE:                                                                                                                                                                                       SUBJECTIVE STATEMENT: Pain is a little less intense, did move a sofa yesterday so it's a little more sore today.  Doesn't anticipate moving furniture any more.   PERTINENT HISTORY: Rt tennis elbow, anxiety, DM, HTN  PAIN:  Are you having pain? Yes: NPRS scale: 6 currently, up to 7-8/10 Pain location: Lt elbow Pain description: sharp  Aggravating factors: lifting/pulling that involves wrist extension Relieving factors: avoiding provoking positions  PRECAUTIONS: None  WEIGHT BEARING RESTRICTIONS: No  FALLS:  Has patient fallen in last 6 months? 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  PLOF: Independent and Leisure: golfing, walking the dog  PATIENT GOALS: improve pain  NEXT MD VISIT: PRN  OBJECTIVE:   PATIENT SURVEYS :  10/25/22: FOTO 51 (predicted 67)  COGNITION: Overall cognitive status: Within functional limits for tasks assessed  UPPER EXTREMITY ROM: WNL bil  UPPER EXTREMITY MMT:  MMT Right eval Left eval  Grip Strength 88.3# 33.2# After DN (36.3#)  Wrist flexion 5/5 5/5  Wrist extension 5/5 3/5 with pain  Wrist pronation 5/5 5/5 with pain  Wrist supination 5/5 5/5   (Blank rows = not tested)   PALPATION:  10/26/22: trigger points noted in Lt wrist extensors   TODAY'S TREATMENT:                                                                                                                                         DATE:  11/01/22 Trigger Point Dry-Needling  Treatment instructions: Expect mild to moderate muscle soreness. S/S of pneumothorax if dry needled over a lung field, and to seek immediate medical attention should they occur. Patient verbalized understanding of these instructions and  education.  Patient Consent Given: Yes Education handout provided: Yes Muscles treated: Lt wrist extensors Electrical stimulation performed: Yes Parameters: 10 mA freq, intensity to tolerance x 5 min Treatment response/outcome: twitch responses, decreased pain, slightly increased grip strength  Manual STM with compression to Lt wrist extensors; skilled palpation and monitoring of soft tissue during DN  10/25/22 TherEx See HEP - demonstrated trial reps with cues needed for comprehension  Trigger Point Dry-Needling  Treatment instructions: Expect mild to moderate muscle soreness. S/S of pneumothorax if dry needled over a lung field, and to seek immediate medical attention should they occur. Patient verbalized understanding of these instructions and education.  Patient Consent Given: Yes Education handout provided: Yes Muscles treated: Lt wrist extensors Electrical stimulation performed: Yes Parameters: 10 mA freq, intensity to tolerance x 5 min Treatment response/outcome: twitch responses, decreased pain, slightly increased grip strength   PATIENT EDUCATION: Education details: HEP, DN Person educated: Patient Education method: Explanation, Demonstration, and Handouts Education comprehension: verbalized understanding, returned demonstration, and needs further education  HOME EXERCISE PROGRAM: Access Code: [email or web address] URL: [email or web address] Date: 10/26/2022 Prepared by: Faustino Congress  Exercises - Standing Wrist Flexion Stretch  - 2 x daily - 7 x weekly - 1 sets - 3 reps - 30 sec hold - Towel Roll Squeeze  - 3 x daily - 7 x weekly - 1 sets - 15 reps - 5 sec hold - Seated Wrist Extension with Dumbbell  - 1 x daily -  7 x weekly - 3 sets - 10 reps  Patient Education - Trigger Point Dry Needling  ASSESSMENT:  CLINICAL IMPRESSION: Pain slightly improved today, and decreased pain reported following DN and manual therapy today.  Will continue to benefit from  PT to maximize function.  OBJECTIVE IMPAIRMENTS: decreased strength, increased fascial restrictions, increased muscle spasms, impaired UE functional use, and pain.   ACTIVITY LIMITATIONS: carrying, lifting, dressing, and hygiene/grooming  PARTICIPATION LIMITATIONS: cleaning, driving, shopping, community activity, and yard work  PERSONAL FACTORS: Past/current experiences and 3+ comorbidities: Rt tennis elbow, anxiety, DM, HTN are also affecting patient's functional outcome.   REHAB POTENTIAL: Good  CLINICAL DECISION MAKING: Stable/uncomplicated  EVALUATION COMPLEXITY: Low  GOALS: Goals reviewed with patient? Yes  SHORT TERM GOALS: Target date: 11/16/2022  Independent with initial HEP Goal status: INITIAL   LONG TERM GOALS: Target date: 12/07/2022  Independent with final HEP Goal status: INITIAL  2.  FOTO score improved to 69 Goal status: INITIAL  3.  Lt grip strength improved to at least 50# for improved strength Goal status: INIITAL  4.  Report pain < 3/10 with lifting activities for improved function Goal status: INITIAL   PLAN: PT FREQUENCY: 1-2x/week  PT DURATION: 6 weeks  PLANNED INTERVENTIONS: Therapeutic exercises, Therapeutic activity, Neuromuscular re-education, Patient/Family education, Self Care, Joint mobilization, Aquatic Therapy, Dry Needling, Electrical stimulation, Cryotherapy, Moist heat, Splintting, Taping, Ultrasound, Ionotophoresis '4mg'$ /ml Dexamethasone, Manual therapy, and Re-evaluation  PLAN FOR NEXT SESSION: reassess grip strength, assess response to DN and repeat as indicated, HEP progression PRN   Laureen Abrahams, PT, DPT 11/01/22 8:29 AM

## 2022-11-01 ENCOUNTER — Encounter: Payer: Self-pay | Admitting: Physical Therapy

## 2022-11-01 ENCOUNTER — Ambulatory Visit (INDEPENDENT_AMBULATORY_CARE_PROVIDER_SITE_OTHER): Payer: 59 | Admitting: Physical Therapy

## 2022-11-01 DIAGNOSIS — M25522 Pain in left elbow: Secondary | ICD-10-CM | POA: Diagnosis not present

## 2022-11-01 DIAGNOSIS — M6281 Muscle weakness (generalized): Secondary | ICD-10-CM | POA: Diagnosis not present

## 2022-11-08 ENCOUNTER — Encounter: Payer: Self-pay | Admitting: Physical Therapy

## 2022-11-08 ENCOUNTER — Ambulatory Visit (INDEPENDENT_AMBULATORY_CARE_PROVIDER_SITE_OTHER): Payer: 59 | Admitting: Physical Therapy

## 2022-11-08 DIAGNOSIS — M6281 Muscle weakness (generalized): Secondary | ICD-10-CM

## 2022-11-08 DIAGNOSIS — M25522 Pain in left elbow: Secondary | ICD-10-CM | POA: Diagnosis not present

## 2022-11-08 NOTE — Therapy (Signed)
OUTPATIENT PHYSICAL THERAPY TREATMENT NOTE   Patient Name: Thomas Richard MRN: [id number] DOB:Dec 09, 1970, 52 y.o., male Today's Date: 11/08/2022  END OF SESSION:   PT End of Session - 11/08/22 0848     Visit Number 3    Number of Visits 12    Date for PT Re-Evaluation 12/07/22    PT Start Time 0847    PT Stop Time 0913    PT Time Calculation (min) 26 min    Activity Tolerance Patient tolerated treatment well    Behavior During Therapy [facility] for tasks assessed/performed              Past Medical History:  Diagnosis Date   Anxiety    Diabetes mellitus without complication ([place])    ED (erectile dysfunction)    Hyperlipidemia    Hypertension    Hypothyroid    Insomnia    Prostatitis    Smoker    Past Surgical History:  Procedure Laterality Date   [place]   Patient Active Problem List   Diagnosis Date Noted   Acute pain of left knee 07/27/2022   Acute pain of left shoulder 07/07/2022   Bruxism, sleep-related 04/27/2022   Snoring 04/27/2022   Prognathia 04/27/2022   Witnessed episode of apnea 04/27/2022   Tobacco dependence with current use 04/27/2022   Colon cancer screening 08/14/2021   Insulin pump in place 08/14/2021   Lateral epicondylitis, right elbow 08/13/2020   Analgesic overuse headache 02/07/2020   Chronic left-sided headaches 02/07/2020   Cigarette smoker 02/07/2020   Insulin pump status 02/07/2020   Chronic pain of left knee 02/20/2019     THERAPY DIAG:  Pain in left elbow  Muscle weakness (generalized)   PCP: Shon Baton, MD  REFERRING PROVIDER: Leandrew Koyanagi, MD  REFERRING DIAG: [phone] (ICD-10-CM) - Pain in left elbow  EVAL THERAPY DIAG:  Pain in left elbow - Plan: PT plan of care cert/re-cert  Muscle weakness (generalized) - Plan: PT plan of care cert/re-cert  Rationale for Evaluation and Treatment: Rehabilitation  ONSET DATE: 3 weeks ago  SUBJECTIVE:                                                                                                                                                                                       SUBJECTIVE STATEMENT: Moved a table over the weekend so arm is more sore today but was doing better until then.   PERTINENT HISTORY: Rt tennis elbow, anxiety, DM, HTN  PAIN:  Are you having pain? Yes: NPRS scale: 5 currently, up to 7-8/10 Pain location: Lt elbow Pain description: sharp Aggravating factors: lifting/pulling that involves wrist  extension Relieving factors: avoiding provoking positions  PRECAUTIONS: None  WEIGHT BEARING RESTRICTIONS: No  FALLS:  Has patient fallen in last 6 months? 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  PLOF: Independent and Leisure: golfing, walking the dog  PATIENT GOALS: improve pain  NEXT MD VISIT: PRN  OBJECTIVE:   PATIENT SURVEYS :  10/25/22: FOTO 51 (predicted 74)  COGNITION: Overall cognitive status: Within functional limits for tasks assessed  UPPER EXTREMITY ROM: WNL bil  UPPER EXTREMITY MMT:  MMT Right eval Left eval Left 11/08/22  Grip Strength 88.3# 33.2# After DN (36.3#) 24.2# (increased soreness)  Wrist flexion 5/5 5/5   Wrist extension 5/5 3/5 with pain   Wrist pronation 5/5 5/5 with pain   Wrist supination 5/5 5/5    (Blank rows = not tested)   PALPATION:  10/26/22: trigger points noted in Lt wrist extensors   TODAY'S TREATMENT:                                                                                                                                         DATE:  11/08/22 Trigger Point Dry-Needling  Treatment instructions: Expect mild to moderate muscle soreness. S/S of pneumothorax if dry needled over a lung field, and to seek immediate medical attention should they occur. Patient verbalized understanding of these instructions and  education.  Patient Consent Given: Yes Education handout provided: Yes Muscles treated: Lt wrist extensors Electrical stimulation performed: no Parameters: n/a Treatment response/outcome: twitch responses, decreased pain, slightly increased grip strength  Manual STM with compression to Lt wrist extensors; skilled palpation and monitoring of soft tissue during DN  TherEx Wrist extensors stretch 2x30 sec on Lt Grip strength testing, see above  11/01/22 Trigger Point Dry-Needling  Treatment instructions: Expect mild to moderate muscle soreness. S/S of pneumothorax if dry needled over a lung field, and to seek immediate medical attention should they occur. Patient verbalized understanding of these instructions and education.  Patient Consent Given: Yes Education handout provided: Yes Muscles treated: Lt wrist extensors Electrical stimulation performed: Yes Parameters: 10 mA freq, intensity to tolerance x 5 min Treatment response/outcome: twitch responses, decreased pain, slightly increased grip strength  Manual STM with compression to Lt wrist extensors; skilled palpation and monitoring of soft tissue during DN  10/25/22 TherEx See HEP - demonstrated trial reps with cues needed for comprehension  Trigger Point Dry-Needling  Treatment instructions: Expect mild to moderate muscle soreness. S/S of pneumothorax if dry needled over a lung field, and to seek immediate medical attention should they occur. Patient verbalized understanding of these instructions and education.  Patient Consent Given: Yes Education handout provided: Yes Muscles treated: Lt wrist extensors Electrical stimulation performed: Yes Parameters: 10 mA freq, intensity to tolerance x 5 min Treatment response/outcome: twitch responses, decreased pain,  slightly increased grip strength   PATIENT EDUCATION: Education details: HEP, DN Person educated: Patient Education method: Explanation, Demonstration, and  Handouts Education comprehension: verbalized understanding, returned demonstration, and needs further education  HOME EXERCISE PROGRAM: Access Code: [email or web address] URL: [email or web address] Date: 10/26/2022 Prepared by: Faustino Congress  Exercises - Standing Wrist Flexion Stretch  - 2 x daily - 7 x weekly - 1 sets - 3 reps - 30 sec hold - Towel Roll Squeeze  - 3 x daily - 7 x weekly - 1 sets - 15 reps - 5 sec hold - Seated Wrist Extension with Dumbbell  - 1 x daily - 7 x weekly - 3 sets - 10 reps  Patient Education - Trigger Point Dry Needling  ASSESSMENT:  CLINICAL IMPRESSION: Grip strength decreased today but likely due to soreness and fatigue so will reassess next visit.  Will continue to benefit from PT to maximize function.  OBJECTIVE IMPAIRMENTS: decreased strength, increased fascial restrictions, increased muscle spasms, impaired UE functional use, and pain.   ACTIVITY LIMITATIONS: carrying, lifting, dressing, and hygiene/grooming  PARTICIPATION LIMITATIONS: cleaning, driving, shopping, community activity, and yard work  PERSONAL FACTORS: Past/current experiences and 3+ comorbidities: Rt tennis elbow, anxiety, DM, HTN are also affecting patient's functional outcome.   REHAB POTENTIAL: Good  CLINICAL DECISION MAKING: Stable/uncomplicated  EVALUATION COMPLEXITY: Low  GOALS: Goals reviewed with patient? Yes  SHORT TERM GOALS: Target date: 11/16/2022  Independent with initial HEP Goal status: INITIAL   LONG TERM GOALS: Target date: 12/07/2022  Independent with final HEP Goal status: INITIAL  2.  FOTO score improved to 69 Goal status: INITIAL  3.  Lt grip strength improved to at least 50# for improved strength Goal status: INIITAL  4.  Report pain < 3/10 with lifting activities for improved function Goal status: INITIAL   PLAN: PT FREQUENCY: 1-2x/week  PT DURATION: 6 weeks  PLANNED INTERVENTIONS: Therapeutic exercises, Therapeutic activity,  Neuromuscular re-education, Patient/Family education, Self Care, Joint mobilization, Aquatic Therapy, Dry Needling, Electrical stimulation, Cryotherapy, Moist heat, Splintting, Taping, Ultrasound, Ionotophoresis '4mg'$ /ml Dexamethasone, Manual therapy, and Re-evaluation  PLAN FOR NEXT SESSION: reassess grip strength (prior to DN), assess response to DN and repeat as indicated, HEP progression PRN   Laureen Abrahams, PT, DPT 11/08/22 9:18 AM

## 2022-11-16 ENCOUNTER — Encounter: Payer: Self-pay | Admitting: Physical Therapy

## 2022-11-16 ENCOUNTER — Ambulatory Visit (INDEPENDENT_AMBULATORY_CARE_PROVIDER_SITE_OTHER): Payer: 59 | Admitting: Physical Therapy

## 2022-11-16 DIAGNOSIS — M25522 Pain in left elbow: Secondary | ICD-10-CM | POA: Diagnosis not present

## 2022-11-16 DIAGNOSIS — M6281 Muscle weakness (generalized): Secondary | ICD-10-CM

## 2022-11-16 NOTE — Therapy (Signed)
OUTPATIENT PHYSICAL THERAPY TREATMENT NOTE   Patient Name: Thomas Richard MRN: [id number] DOB:1971-04-24, 52 y.o., male Today's Date: 11/16/2022  END OF SESSION:   PT End of Session - 11/16/22 0913     Visit Number 4    Number of Visits 12    Date for PT Re-Evaluation 12/07/22    PT Start Time 0844    PT Stop Time 0910    PT Time Calculation (min) 26 min    Activity Tolerance Patient tolerated treatment well    Behavior During Therapy [facility] for tasks assessed/performed               Past Medical History:  Diagnosis Date   Anxiety    Diabetes mellitus without complication ([place])    ED (erectile dysfunction)    Hyperlipidemia    Hypertension    Hypothyroid    Insomnia    Prostatitis    Smoker    Past Surgical History:  Procedure Laterality Date   [place]   Patient Active Problem List   Diagnosis Date Noted   Acute pain of left knee 07/27/2022   Acute pain of left shoulder 07/07/2022   Bruxism, sleep-related 04/27/2022   Snoring 04/27/2022   Prognathia 04/27/2022   Witnessed episode of apnea 04/27/2022   Tobacco dependence with current use 04/27/2022   Colon cancer screening 08/14/2021   Insulin pump in place 08/14/2021   Lateral epicondylitis, right elbow 08/13/2020   Analgesic overuse headache 02/07/2020   Chronic left-sided headaches 02/07/2020   Cigarette smoker 02/07/2020   Insulin pump status 02/07/2020   Chronic pain of left knee 02/20/2019     THERAPY DIAG:  Pain in left elbow  Muscle weakness (generalized)   PCP: Shon Baton, MD  REFERRING PROVIDER: Leandrew Koyanagi, MD  REFERRING DIAG: [id number] (ICD-10-CM) - Pain in left elbow  EVAL THERAPY DIAG:  Pain in left elbow - Plan: PT plan of care cert/re-cert  Muscle weakness (generalized) - Plan: PT plan of care cert/re-cert  Rationale for Evaluation and Treatment: Rehabilitation  ONSET DATE: 3 weeks ago  SUBJECTIVE:                                                                                                                                                                                       SUBJECTIVE STATEMENT: Lt elbow is still very sore and little movements are aggravating  PERTINENT HISTORY: Rt tennis elbow, anxiety, DM, HTN  PAIN:  Are you having pain? Yes: NPRS scale: 6-7 currently/10 Pain location: Lt elbow Pain description: sharp Aggravating factors: lifting/pulling that involves wrist extension Relieving factors: avoiding provoking positions  PRECAUTIONS: None  WEIGHT BEARING RESTRICTIONS: No  FALLS:  Has patient fallen in last 6 months? 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  PLOF: Independent and Leisure: golfing, walking the dog  PATIENT GOALS: improve pain  NEXT MD VISIT: PRN  OBJECTIVE:   PATIENT SURVEYS :  10/25/22: FOTO 51 (predicted 73) 11/16/22: FOTO 44  COGNITION: Overall cognitive status: Within functional limits for tasks assessed  UPPER EXTREMITY ROM: WNL bil  UPPER EXTREMITY MMT:  MMT Right eval Left eval Left 11/08/22 Left 11/16/22  Grip Strength 88.3# 33.2# After DN (36.3#) 24.2# (increased soreness) 30#  Wrist flexion 5/5 5/5    Wrist extension 5/5 3/5 with pain    Wrist pronation 5/5 5/5 with pain    Wrist supination 5/5 5/5     (Blank rows = not tested)   PALPATION:  10/26/22: trigger points noted in Lt wrist extensors   TODAY'S TREATMENT:                                                                                                                                         DATE:  11/16/22 Grip strength testing prior to session; decrease noted overall  Trigger Point Dry-Needling  Treatment instructions: Expect mild to moderate muscle soreness. S/S of pneumothorax if dry needled over a lung field, and to seek immediate medical attention should they  occur. Patient verbalized understanding of these instructions and education.  Patient Consent Given: Yes Education handout provided: Yes Muscles treated: Lt supinator, wrist extensor Electrical stimulation performed: no Parameters: n/a Treatment response/outcome: twitch responses  Manual STM with compression to Lt wrist extensors; skilled palpation and monitoring of soft tissue during DN  11/08/22 Trigger Point Dry-Needling  Treatment instructions: Expect mild to moderate muscle soreness. S/S of pneumothorax if dry needled over a lung field, and to seek immediate medical attention should they occur. Patient verbalized understanding of these instructions and education.  Patient Consent Given: Yes Education handout provided: Yes Muscles treated: Lt wrist extensors Electrical stimulation performed: no Parameters: n/a Treatment response/outcome: twitch responses, decreased pain, slightly increased grip strength  Manual STM with compression to Lt wrist extensors; skilled palpation and monitoring of soft tissue during DN  TherEx Wrist extensors stretch 2x30 sec on Lt Grip strength testing, see above  11/01/22 Trigger Point Dry-Needling  Treatment instructions: Expect mild to moderate muscle soreness. S/S of pneumothorax if dry needled over a lung field, and to seek immediate medical attention should they occur. Patient verbalized understanding of these instructions and education.  Patient Consent Given: Yes Education handout provided: Yes Muscles treated: Lt wrist extensors Electrical stimulation performed: Yes Parameters: 10 mA freq, intensity to tolerance x 5 min Treatment response/outcome: twitch responses, decreased pain, slightly increased grip strength  Manual STM with compression to Lt wrist extensors; skilled palpation and  monitoring of soft tissue during DN  10/25/22 TherEx See HEP - demonstrated trial reps with cues needed for comprehension  Trigger Point Dry-Needling   Treatment instructions: Expect mild to moderate muscle soreness. S/S of pneumothorax if dry needled over a lung field, and to seek immediate medical attention should they occur. Patient verbalized understanding of these instructions and education.  Patient Consent Given: Yes Education handout provided: Yes Muscles treated: Lt wrist extensors Electrical stimulation performed: Yes Parameters: 10 mA freq, intensity to tolerance x 5 min Treatment response/outcome: twitch responses, decreased pain, slightly increased grip strength   PATIENT EDUCATION: Education details: HEP, DN Person educated: Patient Education method: Explanation, Demonstration, and Handouts Education comprehension: verbalized understanding, returned demonstration, and needs further education  HOME EXERCISE PROGRAM: Access Code: [email or web address] URL: [email or web address] Date: 10/26/2022 Prepared by: Faustino Congress  Exercises - Standing Wrist Flexion Stretch  - 2 x daily - 7 x weekly - 1 sets - 3 reps - 30 sec hold - Towel Roll Squeeze  - 3 x daily - 7 x weekly - 1 sets - 15 reps - 5 sec hold - Seated Wrist Extension with Dumbbell  - 1 x daily - 7 x weekly - 3 sets - 10 reps  Patient Education - Trigger Point Dry Needling  ASSESSMENT:  CLINICAL IMPRESSION: Pt with limited progress noted at this time, and reports pain seems to be increasing.  Trial of DN to supinator today but if this is not beneficial recommend he follow up with MD to discuss other options.  Will hold PT today.   OBJECTIVE IMPAIRMENTS: decreased strength, increased fascial restrictions, increased muscle spasms, impaired UE functional use, and pain.   ACTIVITY LIMITATIONS: carrying, lifting, dressing, and hygiene/grooming  PARTICIPATION LIMITATIONS: cleaning, driving, shopping, community activity, and yard work  PERSONAL FACTORS: Past/current experiences and 3+ comorbidities: Rt tennis elbow, anxiety, DM, HTN are also affecting  patient's functional outcome.   REHAB POTENTIAL: Good  CLINICAL DECISION MAKING: Stable/uncomplicated  EVALUATION COMPLEXITY: Low  GOALS: Goals reviewed with patient? Yes  SHORT TERM GOALS: Target date: 11/16/2022  Independent with initial HEP Goal status: MET 11/16/22   LONG TERM GOALS: Target date: 12/07/2022  Independent with final HEP Goal status: Met to date 11/16/22  2.  FOTO score improved to 69 Goal status: Ongoing  11/16/22  3.  Lt grip strength improved to at least 50# for improved strength Goal status: Ongoing  11/16/22  4.  Report pain < 3/10 with lifting activities for improved function Goal status: Ongoing  11/16/22   PLAN: PT FREQUENCY: 1-2x/week  PT DURATION: 6 weeks  PLANNED INTERVENTIONS: Therapeutic exercises, Therapeutic activity, Neuromuscular re-education, Patient/Family education, Self Care, Joint mobilization, Aquatic Therapy, Dry Needling, Electrical stimulation, Cryotherapy, Moist heat, Splintting, Taping, Ultrasound, Ionotophoresis 79m/ml Dexamethasone, Manual therapy, and Re-evaluation  PLAN FOR NEXT SESSION: possibly hold PT, see what MD says   SLaureen Abrahams PT, DPT 11/16/22 9:17 AM

## 2022-11-24 ENCOUNTER — Ambulatory Visit (INDEPENDENT_AMBULATORY_CARE_PROVIDER_SITE_OTHER): Payer: 59 | Admitting: Physical Therapy

## 2022-11-24 ENCOUNTER — Encounter: Payer: Self-pay | Admitting: Physical Therapy

## 2022-11-24 DIAGNOSIS — M6281 Muscle weakness (generalized): Secondary | ICD-10-CM

## 2022-11-24 DIAGNOSIS — M25522 Pain in left elbow: Secondary | ICD-10-CM | POA: Diagnosis not present

## 2022-11-24 NOTE — Therapy (Signed)
OUTPATIENT PHYSICAL THERAPY TREATMENT NOTE   Patient Name: Thomas Richard MRN: [id number] DOB:10/11/1970, 52 y.o., male Today's Date: 11/24/2022  END OF SESSION:   PT End of Session - 11/24/22 0930     Visit Number 5    Number of Visits 12    Date for PT Re-Evaluation 12/07/22    PT Start Time 0928    PT Stop Time 1000    PT Time Calculation (min) 32 min    Activity Tolerance Patient tolerated treatment well    Behavior During Therapy [facility] for tasks assessed/performed                Past Medical History:  Diagnosis Date   Anxiety    Diabetes mellitus without complication ([place])    ED (erectile dysfunction)    Hyperlipidemia    Hypertension    Hypothyroid    Insomnia    Prostatitis    Smoker    Past Surgical History:  Procedure Laterality Date   [place]   Patient Active Problem List   Diagnosis Date Noted   Acute pain of left knee 07/27/2022   Acute pain of left shoulder 07/07/2022   Bruxism, sleep-related 04/27/2022   Snoring 04/27/2022   Prognathia 04/27/2022   Witnessed episode of apnea 04/27/2022   Tobacco dependence with current use 04/27/2022   Colon cancer screening 08/14/2021   Insulin pump in place 08/14/2021   Lateral epicondylitis, right elbow 08/13/2020   Analgesic overuse headache 02/07/2020   Chronic left-sided headaches 02/07/2020   Cigarette smoker 02/07/2020   Insulin pump status 02/07/2020   Chronic pain of left knee 02/20/2019     THERAPY DIAG:  Pain in left elbow  Muscle weakness (generalized)   PCP: Shon Baton, MD  REFERRING PROVIDER: Leandrew Koyanagi, MD  REFERRING DIAG: [phone] (ICD-10-CM) - Pain in left elbow  EVAL THERAPY DIAG:  Pain in left elbow - Plan: PT plan of care cert/re-cert  Muscle weakness (generalized) - Plan: PT plan of care cert/re-cert  Rationale for Evaluation and Treatment: Rehabilitation  ONSET DATE: 3 weeks ago  SUBJECTIVE:                                                                                                                                                                                       SUBJECTIVE STATEMENT: Last session was helpful, muscle pain is mainly resolved.  Still having sharp point tenderness at lateral epicondyle.  PERTINENT HISTORY: Rt tennis elbow, anxiety, DM, HTN  PAIN:  Are you having pain? Yes: NPRS scale: 6-7 currently/10 Pain location: Lt elbow Pain description: sharp Aggravating factors: lifting/pulling that involves wrist extension Relieving  factors: avoiding provoking positions  PRECAUTIONS: None  WEIGHT BEARING RESTRICTIONS: No  FALLS:  Has patient fallen in last 6 months? 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  PLOF: Independent and Leisure: golfing, walking the dog  PATIENT GOALS: improve pain  NEXT MD VISIT: PRN  OBJECTIVE:   PATIENT SURVEYS :  10/25/22: FOTO 51 (predicted 45) 11/16/22: FOTO 44  COGNITION: Overall cognitive status: Within functional limits for tasks assessed  UPPER EXTREMITY ROM: WNL bil  UPPER EXTREMITY MMT:  MMT Right eval Left eval Left 11/08/22 Left 11/16/22 Left 11/24/22  Grip Strength 88.3# 33.2# After DN (36.3#) 24.2# (increased soreness) 30# 43.2#  Wrist flexion 5/5 5/5     Wrist extension 5/5 3/5 with pain     Wrist pronation 5/5 5/5 with pain     Wrist supination 5/5 5/5      (Blank rows = not tested)   PALPATION:  10/26/22: trigger points noted in Lt wrist extensors   TODAY'S TREATMENT:                                                                                                                                         DATE:  11/24/22 TherEx Grip strength testing prior to session; improvement noted today Recommended continue exercises, and recommend progressing to eccentrics if pain allows  Trigger Point  Dry-Needling  Treatment instructions: Expect mild to moderate muscle soreness. S/S of pneumothorax if dry needled over a lung field, and to seek immediate medical attention should they occur. Patient verbalized understanding of these instructions and education.  Patient Consent Given: Yes Education handout provided: Yes Muscles treated: Lt wrist extensor Electrical stimulation performed: no Parameters: n/a Treatment response/outcome: twitch responses  Manual STM with compression to Lt wrist extensors; skilled palpation and monitoring of soft tissue during DN  11/16/22 Grip strength testing prior to session; decrease noted overall  Trigger Point Dry-Needling  Treatment instructions: Expect mild to moderate muscle soreness. S/S of pneumothorax if dry needled over a lung field, and to seek immediate medical attention should they occur. Patient verbalized understanding of these instructions and education.  Patient Consent Given: Yes Education handout provided: Yes Muscles treated: Lt supinator, wrist extensor Electrical stimulation performed: no Parameters: n/a Treatment response/outcome: twitch responses  Manual STM with compression to Lt wrist extensors; skilled palpation and monitoring of soft tissue during DN  11/08/22 Trigger Point Dry-Needling  Treatment instructions: Expect mild to moderate muscle soreness. S/S of pneumothorax if dry needled over a lung field, and to seek immediate medical attention should they occur. Patient verbalized understanding of these instructions and education.  Patient Consent Given: Yes Education handout provided: Yes Muscles treated: Lt wrist extensors Electrical stimulation performed: no Parameters: n/a Treatment response/outcome: twitch responses, decreased pain, slightly increased grip strength  Manual STM  with compression to Lt wrist extensors; skilled palpation and monitoring of soft tissue during DN  TherEx Wrist extensors stretch 2x30  sec on Lt Grip strength testing, see above  11/01/22 Trigger Point Dry-Needling  Treatment instructions: Expect mild to moderate muscle soreness. S/S of pneumothorax if dry needled over a lung field, and to seek immediate medical attention should they occur. Patient verbalized understanding of these instructions and education.  Patient Consent Given: Yes Education handout provided: Yes Muscles treated: Lt wrist extensors Electrical stimulation performed: Yes Parameters: 10 mA freq, intensity to tolerance x 5 min Treatment response/outcome: twitch responses, decreased pain, slightly increased grip strength  Manual STM with compression to Lt wrist extensors; skilled palpation and monitoring of soft tissue during DN  10/25/22 TherEx See HEP - demonstrated trial reps with cues needed for comprehension  Trigger Point Dry-Needling  Treatment instructions: Expect mild to moderate muscle soreness. S/S of pneumothorax if dry needled over a lung field, and to seek immediate medical attention should they occur. Patient verbalized understanding of these instructions and education.  Patient Consent Given: Yes Education handout provided: Yes Muscles treated: Lt wrist extensors Electrical stimulation performed: Yes Parameters: 10 mA freq, intensity to tolerance x 5 min Treatment response/outcome: twitch responses, decreased pain, slightly increased grip strength   PATIENT EDUCATION: Education details: HEP, DN Person educated: Patient Education method: Explanation, Demonstration, and Handouts Education comprehension: verbalized understanding, returned demonstration, and needs further education  HOME EXERCISE PROGRAM: Access Code: [email or web address] URL: [email or web address] Date: 10/26/2022 Prepared by: Faustino Congress  Exercises - Standing Wrist Flexion Stretch  - 2 x daily - 7 x weekly - 1 sets - 3 reps - 30 sec hold - Towel Roll Squeeze  - 3 x daily - 7 x weekly - 1 sets - 15  reps - 5 sec hold - Seated Wrist Extension with Dumbbell  - 1 x daily - 7 x weekly - 3 sets - 10 reps  Patient Education - Trigger Point Dry Needling  ASSESSMENT:  CLINICAL IMPRESSION: Pt tolerated session well today with repeat trial of DN to last painful area.  Concerned about possible partial tendon tear, so set up U/S with Dr. Rolena Infante next week.    OBJECTIVE IMPAIRMENTS: decreased strength, increased fascial restrictions, increased muscle spasms, impaired UE functional use, and pain.   ACTIVITY LIMITATIONS: carrying, lifting, dressing, and hygiene/grooming  PARTICIPATION LIMITATIONS: cleaning, driving, shopping, community activity, and yard work  PERSONAL FACTORS: Past/current experiences and 3+ comorbidities: Rt tennis elbow, anxiety, DM, HTN are also affecting patient's functional outcome.   REHAB POTENTIAL: Good  CLINICAL DECISION MAKING: Stable/uncomplicated  EVALUATION COMPLEXITY: Low  GOALS: Goals reviewed with patient? Yes  SHORT TERM GOALS: Target date: 11/16/2022  Independent with initial HEP Goal status: MET 11/16/22   LONG TERM GOALS: Target date: 12/07/2022  Independent with final HEP Goal status: Met to date 11/16/22  2.  FOTO score improved to 69 Goal status: Ongoing  11/16/22  3.  Lt grip strength improved to at least 50# for improved strength Goal status: Ongoing  11/16/22  4.  Report pain < 3/10 with lifting activities for improved function Goal status: Ongoing  11/16/22   PLAN: PT FREQUENCY: 1-2x/week  PT DURATION: 6 weeks  PLANNED INTERVENTIONS: Therapeutic exercises, Therapeutic activity, Neuromuscular re-education, Patient/Family education, Self Care, Joint mobilization, Aquatic Therapy, Dry Needling, Electrical stimulation, Cryotherapy, Moist heat, Splintting, Taping, Ultrasound, Ionotophoresis 58m/ml Dexamethasone, Manual therapy, and Re-evaluation  PLAN FOR NEXT SESSION: see how appt went, continue as indicated   [place]  Staci Righter, PT,  DPT 11/24/22 11:21 AM

## 2022-11-30 ENCOUNTER — Encounter: Payer: Self-pay | Admitting: Sports Medicine

## 2022-11-30 ENCOUNTER — Ambulatory Visit (INDEPENDENT_AMBULATORY_CARE_PROVIDER_SITE_OTHER): Payer: 59 | Admitting: Sports Medicine

## 2022-11-30 ENCOUNTER — Ambulatory Visit: Payer: Self-pay

## 2022-11-30 DIAGNOSIS — M7712 Lateral epicondylitis, left elbow: Secondary | ICD-10-CM | POA: Diagnosis not present

## 2022-11-30 DIAGNOSIS — M25522 Pain in left elbow: Secondary | ICD-10-CM

## 2022-11-30 NOTE — Progress Notes (Signed)
2 months of pain Has tried PT for it (dry needling) with no relief (5 sessions)  Can feel the pain down into lower arm/ numbness and tingling

## 2022-11-30 NOTE — Progress Notes (Addendum)
Thomas Richard - 52 y.o. male MRN [id number]  Date of birth: Dec 28, 1970  Office Visit Note: Visit Date: 11/30/2022 PCP: Shon Baton, MD Referred by: Shon Baton, MD  Subjective: Chief Complaint  Patient presents with   Left Elbow - Pain   HPI: Thomas Richard is a pleasant 52 y.o. male who presents today for chronic left elbow pain.  He has had at least 2 months or more of pain.  Had a similar pain over the right elbow that then did improve with physical therapy and dry needling.  He has been progressing through therapy but his pain and strength is limiting secondary to his discomfort. Pain with gripping activities.  Has tried PT and dry needling with transient relief, though his pain returns.  Pain will radiate down into the arm.  Denies any N/T symptoms into the fingers.  Pertinent ROS were reviewed with the patient and found to be negative unless otherwise specified above in HPI.   Assessment & Plan: Visit Diagnoses:  1. Lateral epicondylitis, left elbow   2. 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  I did not see any high-grade tearing at this level with ultrasound.  Discussed continuing physical therapy, home rehab, injection (CSI vs. PRP) therapy, extracorporeal shockwave treatment therapy.  He will proceed with ultrasound-guided corticosteroid injection in the following week to calm this down. He is also interested in setting of for extracorporeal shockwave therapy, which we will begin > 1 week out from his injection if not markedly improved.  We did place him into a cock-up wrist brace to prevent wrist extension and provocative maneuvers during the day.  Depending on his improvement with the upcoming injection, may consider trials of extracorporeal shockwave treatment therapy.   Also consider nitroglycerin patch protocol.  Follow-up: Return for US-guided lateral epicondyle injection (30-min).   Meds & Orders: No orders of the defined types were placed in this encounter.   Orders Placed This Encounter  Procedures   [place]     Procedures: No procedures performed      Clinical History: No specialty comments available.  He reports that he has been smoking cigarettes. He has a 10.00 pack-year smoking history. He has never used smokeless tobacco. No results for input(s): "HGBA1C", "LABURIC" in the last 8760 hours.  Objective:    Physical Exam  Gen: Well-appearing, in no acute distress; non-toxic CV: Regular Rate. Well-perfused. Warm.  Resp: Breathing unlabored on room air; no wheezing. Psych: Fluid speech in conversation; appropriate affect; normal thought process Neuro: Sensation intact throughout. No gross coordination deficits.   Ortho Exam - Left elbow: Evaluation of the left elbow shows no effusion, redness or overlying skin changes.  Positive TTP palpating over the lateral epicondyle.  There is marked pain with resisted wrist extension and resisted wrist pronation.  + third finger resisted extension.  Imaging: [place] Extrem Up Left Ltd  Result Date: 11/30/2022 Limited musculoskeletal ultrasound of the left upper extremity, left elbow was performed today.  Evaluation of the lateral epicondyle was seen without cortical regularity.  Radiocapitellar joint without joint effusion.  Long axis and short axis evaluation of the common extensor tendons and straightening greater trochanter was visualized with rather marked tendinosis with likely some low-grade partial tearing, although certainly no high-grade  or full-thickness tears.  There is notable hyperemia insertion of the common extensor tendon.  There is also hyperemia noted at the brachioradialis tendon just superior to this.       Narrative & Impression  CLINICAL DATA:  Lateral elbow pain.   Lateral epicondylitis.   EXAM: MRI OF THE RIGHT ELBOW WITHOUT CONTRAST   TECHNIQUE: Multiplanar, multisequence MR imaging of the elbow was performed. No intravenous contrast was administered.   COMPARISON:  Radiographs 09/15/2018. No recent radiographs available.   FINDINGS: TENDONS   Common forearm flexor origin: Intact with normal signal.   Common forearm extensor origin: Marked tendinosis with partial tendon tearing at the humeral attachment.   Biceps: Intact.   Triceps: Intact with normal signal.   LIGAMENTS   Medial stabilizers: Intact.   Lateral stabilizers: The humeral attachment of the radial collateral ligament is not well visualized and may be torn. The lateral ulnar collateral ligament appears intact.   Cartilage: Preserved. No focal chondral defect demonstrated. Mild spurring in the ulnohumeral compartment.   Joint: Small joint effusion.  No loose body observed.   Cubital tunnel: Unremarkable.  The ulnar nerve appears normal.   Bones: No acute or significant extra-articular osseous findings.   Other: None.   IMPRESSION: 1. Prominent tendinosis and partial tearing of the common extensor tendon. No focal muscular atrophy or edema. 2. The humeral attachment of the radial collateral ligament is not well visualized and may be torn. 3. Mild ulnohumeral compartment spurring without acute osseous findings or intra-articular loose body. 4. The additional elbow tendons and ligaments appear intact.     Electronically Signed   By: Richardean Sale M.D.   On: 07/25/2021 10:19    Past Medical/Family/Surgical/Social History: Medications & Allergies reviewed per EMR, new medications updated. Patient Active Problem List   Diagnosis Date Noted   Acute pain of left knee 07/27/2022   Acute pain of left shoulder 07/07/2022   Bruxism, sleep-related 04/27/2022   Snoring 04/27/2022   Prognathia 04/27/2022   Witnessed episode of apnea 04/27/2022   Tobacco  dependence with current use 04/27/2022   Colon cancer screening 08/14/2021   Insulin pump in place 08/14/2021   Lateral epicondylitis, right elbow 08/13/2020   Analgesic overuse headache 02/07/2020   Chronic left-sided headaches 02/07/2020   Cigarette smoker 02/07/2020   Insulin pump status 02/07/2020   Chronic pain of left knee 02/20/2019   Past Medical History:  Diagnosis Date   Anxiety    Diabetes mellitus without complication ([place])    ED (erectile dysfunction)    Hyperlipidemia    Hypertension    Hypothyroid    Insomnia    Prostatitis    Smoker    Family History  Problem Relation Age of Onset   Depression Mother    Lung disease Father    Diabetes Father    Hypertension Father    Colon cancer Neg Hx    Esophageal cancer Neg Hx    Rectal cancer Neg Hx    Inflammatory bowel disease Neg Hx    Liver disease Neg Hx    Pancreatic cancer Neg Hx    Stomach cancer Neg Hx    Past Surgical History:  Procedure Laterality Date   WISDOM TOOTH EXTRACTION  1996   Social History   Occupational History   Not on file  Tobacco Use   Smoking status: Every Day    Packs/day: 1.00    Years: 10.00    Total pack years: 10.00    Types: Cigarettes  Smokeless tobacco: Never  Vaping Use   Vaping Use: Never used  Substance and Sexual Activity   Alcohol use: Yes    Alcohol/week: 0.0 standard drinks of alcohol    Comment: 2 beers per dasy   Drug use: No   Sexual activity: Not on file   I spent 33 minutes in the care of the patient today including face-to-face time, preparation to see the patient, as well as review of previous PT notes (1/23 - 11/24/22), discussion with Faustino Congress, PT, review of injection therapies (CSI, PRP) and alternative treatment options such as extracorporeal shockwave treatment therapy, bracing for the above diagnoses.   Elba Barman, DO Primary Care Sports Medicine Physician  [place] [place]  This note was dictated using Dragon  naturally speaking software and may contain errors in syntax, spelling, or content which have not been identified prior to signing this note.

## 2022-12-06 ENCOUNTER — Ambulatory Visit: Payer: Self-pay

## 2022-12-06 ENCOUNTER — Ambulatory Visit (INDEPENDENT_AMBULATORY_CARE_PROVIDER_SITE_OTHER): Payer: 59 | Admitting: Sports Medicine

## 2022-12-06 ENCOUNTER — Encounter: Payer: Self-pay | Admitting: Sports Medicine

## 2022-12-06 DIAGNOSIS — M7712 Lateral epicondylitis, left elbow: Secondary | ICD-10-CM | POA: Diagnosis not present

## 2022-12-06 DIAGNOSIS — M25522 Pain in left elbow: Secondary | ICD-10-CM

## 2022-12-06 MED ORDER — BETAMETHASONE SOD PHOS & ACET 6 (3-3) MG/ML IJ SUSP
6.0000 mg | INTRAMUSCULAR | Status: AC | PRN
  Administered 2022-12-06: 6 mg via INTRA_ARTICULAR

## 2022-12-06 MED ORDER — LIDOCAINE HCL 1 % IJ SOLN
1.0000 mL | INTRAMUSCULAR | Status: AC | PRN
Start: 2022-12-06 — End: 2022-12-06
  Administered 2022-12-06: 1 mL

## 2022-12-06 MED ORDER — BUPIVACAINE HCL 0.25 % IJ SOLN
1.0000 mL | INTRAMUSCULAR | Status: AC | PRN
Start: 2022-12-06 — End: 2022-12-06
  Administered 2022-12-06: 1 mL

## 2022-12-06 NOTE — Progress Notes (Signed)
   Procedure Note  Patient: Thomas Richard             Date of Birth: 08-10-1971           MRN: [id number]             Visit Date: 12/06/2022  Procedures: Visit Diagnoses:  1. Lateral epicondylitis, left elbow   2. Pain in left elbow    Hand/UE Inj: L elbow for lateral epicondylitis on 12/06/2022 2:02 PM Indications: pain and tendon swelling Details: 25 G needle, ultrasound-guided dorsal approach Medications: 1 mL lidocaine 1 %; 1 mL bupivacaine 0.25 %; 6 mg betamethasone acetate-betamethasone sodium phosphate 6 (3-3) MG/ML Outcome: tolerated well, no immediate complications  Procedure: Lateral Epicondylitis (Common Extensor Tendon) Injection, Left Elbow After informed verbal consent was obtained, a timeout was performed.  Patient was lying supine on exam table.  Area overlying the affected lateral epicondyle was prepped with chloraprep and alcohol swabs. Then utilizing ultrasound guidance via an in-plane approach, the patient's lateral epicondyle and common extensor tendon was injected with 1:1:1 lidocaine:bupivicaine:betamethasone with multiple needle fenestrations from a distal to proximal direction. Patient tolerated procedure well without immediate complications.  Procedure, treatment alternatives, risks and benefits explained, specific risks discussed. Consent was given by the patient. Immediately prior to procedure a time out was called to verify the correct patient, procedure, equipment, support staff and site/side marked as required. Patient was prepped and draped in the usual sterile fashion.    - I evaluated the patient about 10 minutes post-injection and he had improvement in pain - he will see how this feels over the next 2-4 weeks and let me know how he is doing;  we did discuss ECSWT today - if not improved, may consider this   Elba Barman, DO [place] [facility]  This note was dictated using Dragon naturally speaking  software and may contain errors in syntax, spelling, or content which have not been identified prior to signing this note.

## 2023-01-05 ENCOUNTER — Encounter: Payer: Self-pay | Admitting: Physical Therapy

## 2023-01-05 NOTE — Therapy (Signed)
PHYSICAL THERAPY DISCHARGE SUMMARY  Visits from Start of Care: 5  Current functional level related to goals / functional outcomes: Inactive >30 days, DC per clinic policy    Remaining deficits: N/A    Education / Equipment: N/A    Patient agrees to discharge. Patient goals were not met. Patient is being discharged due to not returning since the last visit.  Deniece Ree PT DPT PN2

## 2023-02-09 ENCOUNTER — Other Ambulatory Visit: Payer: Self-pay | Admitting: Internal Medicine

## 2023-02-09 DIAGNOSIS — E785 Hyperlipidemia, unspecified: Secondary | ICD-10-CM

## 2023-02-20 ENCOUNTER — Encounter: Payer: Self-pay | Admitting: Sports Medicine

## 2023-03-07 ENCOUNTER — Ambulatory Visit: Payer: 59 | Admitting: Sports Medicine

## 2023-03-18 ENCOUNTER — Ambulatory Visit
Admission: RE | Admit: 2023-03-18 | Discharge: 2023-03-18 | Disposition: A | Discharge status: Home / Self Care | Payer: No Typology Code available for payment source | Source: Ambulatory Visit | Attending: Internal Medicine | Admitting: Internal Medicine

## 2023-03-18 DIAGNOSIS — E785 Hyperlipidemia, unspecified: Secondary | ICD-10-CM

## 2023-03-24 ENCOUNTER — Other Ambulatory Visit: Payer: Self-pay

## 2023-03-24 ENCOUNTER — Ambulatory Visit: Payer: 59 | Admitting: Sports Medicine

## 2023-03-24 DIAGNOSIS — M7712 Lateral epicondylitis, left elbow: Secondary | ICD-10-CM

## 2023-03-24 MED ORDER — TRAMADOL HCL 50 MG PO TABS: 50.0000 mg | ORAL_TABLET | Freq: Four times a day (QID) | ORAL | 0 refills | Status: DC | PRN

## 2023-03-24 NOTE — Patient Instructions (Addendum)
*  Post-PRP Injection Guidelines: No anti-inflammatories (ibuprofen/motrin, aleve, meloxicam, etc.) for 2 weeks.  No ice for 2 weeks.  Short prescription of tramadol may be written if pain is severe, I did send this in today.   -Expect a fair amount of pain for the next 3 days -Very limited use of the arm for the first week -For the second week, you may slowly start to use the arm but do not take anything more than a cup of coffee -After 2 weeks, he may start getting back into activity without restrictions, let pain be her guide.  It is usually wise at this time to get back into formalized physical therapy and to restart the home elbow exercises -Expect maximum results between the 8-12 week mark  3.)  Supplements helpful for arthritis, joint health and inflammation improvement: I do like MoveMD, Turmeric (curcumin) Boswellia serrata, collagen hydrolysate, Glucosamine-chondroitin  *It is very important to stay hydrated, drinking lots of water throughout the day. This helps hydrate structures within the knee.  - Dr. Shon Baton

## 2023-03-24 NOTE — Progress Notes (Signed)
   Procedure Note  Patient: Thomas Richard             Date of Birth: 05-Apr-1971           MRN: [id number]             Visit Date: 03/24/2023  Procedures: Visit Diagnoses:  1. Lateral epicondylitis, left elbow    US-guided PRP Lateral Epicondyle & Common Extensor Tendon Injection, Left: After discussion on risks/benefits/indications, informed verbal consent was obtained and a timeout was performed.  Patient was seated on exam table with elbow in flexed position. Area overlying the affected lateral epicondyle was prepped with chloraprep and alcohol swabs. Approximately 2cc of lidocaine 1% was used to anesthesized the skin and soft tissue without any going into the tendon. Then utilizing ultrasound guidance via an in-plane approach, the patient's lateral epicondyle and common extensor tendon was injected with 5 cc of platelet-rich-plasma (leukocyte-rich). Visualization of injectate spread within the common extensor tendon origin was visualized dynamically under ultrasound guidance. Patient tolerated procedure well without immediate complications, had expected mild postinjection pain.  Band-Aid and compressive wrap was applied.   Kit: RegenLab: RegenPlasma; THT-3 kit     *Post-PRP Injection Guidelines: No anti-inflammatories (ibuprofen/motrin, aleve, meloxicam, etc.) for 2 weeks.  No ice for 2 weeks.  Short prescription of tramadol was written for severe pain, patient only needs to pick it up if needed for pain. Appropriate rest / bracing / and timeframe for beginning therapy discussed and patient endorsed understanding.   Madelyn Brunner, DO Primary Care Sports Medicine Physician  [facility]  This note was dictated using Dragon naturally speaking software and may contain errors in syntax, spelling, or content which have not been identified prior to signing this note.

## 2023-03-28 ENCOUNTER — Encounter: Payer: Self-pay | Admitting: Physical Therapy

## 2023-03-31 ENCOUNTER — Other Ambulatory Visit: Payer: Self-pay | Admitting: Sports Medicine

## 2023-03-31 ENCOUNTER — Encounter: Payer: Self-pay | Admitting: Physical Therapy

## 2023-03-31 DIAGNOSIS — M7712 Lateral epicondylitis, left elbow: Secondary | ICD-10-CM

## 2023-04-12 ENCOUNTER — Other Ambulatory Visit: Payer: Self-pay

## 2023-04-12 ENCOUNTER — Ambulatory Visit (INDEPENDENT_AMBULATORY_CARE_PROVIDER_SITE_OTHER): Payer: 59 | Admitting: Physical Therapy

## 2023-04-12 ENCOUNTER — Encounter: Payer: Self-pay | Admitting: Physical Therapy

## 2023-04-12 DIAGNOSIS — M25522 Pain in left elbow: Secondary | ICD-10-CM | POA: Diagnosis not present

## 2023-04-12 DIAGNOSIS — M6281 Muscle weakness (generalized): Secondary | ICD-10-CM | POA: Diagnosis not present

## 2023-04-12 NOTE — Therapy (Signed)
OUTPATIENT PHYSICAL THERAPY EVALUATION   Patient Name: Mohmmad Thissell MRN: [id number] DOB:[birth year], 52 y.o., male Today's Date: 04/12/2023  END OF SESSION:  PT End of Session - 04/12/23 0908     Visit Number 1    Number of Visits 8    Date for PT Re-Evaluation 06/07/23    Authorization Type [employer] $20 copay    PT Start Time 0840    PT Stop Time 0900    PT Time Calculation (min) 20 min    Activity Tolerance Patient tolerated treatment well    Behavior During Therapy [facility] for tasks assessed/performed              Past Medical History:  Diagnosis Date   Anxiety    Diabetes mellitus without complication ([facility])    ED (erectile dysfunction)    Hyperlipidemia    Hypertension    Hypothyroid    Insomnia    Prostatitis    Smoker    Past Surgical History:  Procedure Laterality Date   WISDOM TOOTH EXTRACTION  1996   Patient Active Problem List   Diagnosis Date Noted   Acute pain of left knee 07/27/2022   Acute pain of left shoulder 07/07/2022   Bruxism, sleep-related 04/27/2022   Snoring 04/27/2022   Prognathia 04/27/2022   Witnessed episode of apnea 04/27/2022   Tobacco dependence with current use 04/27/2022   Colon cancer screening 08/14/2021   Insulin pump in place 08/14/2021   Lateral epicondylitis, right elbow 08/13/2020   Analgesic overuse headache 02/07/2020   Chronic left-sided headaches 02/07/2020   Cigarette smoker 02/07/2020   Insulin pump status 02/07/2020   Chronic pain of left knee 02/20/2019    PCP: Creola Corn, MD  REFERRING PROVIDER: Madelyn Brunner, DO  REFERRING DIAG: [phone] (ICD-10-CM) - Pain in left elbow  THERAPY DIAG:  Pain in left elbow - Plan: PT plan of care cert/re-cert  Muscle weakness (generalized) - Plan: PT plan of care cert/re-cert  Rationale for Evaluation and Treatment: Rehabilitation  ONSET DATE: Jan 2024  SUBJECTIVE:                                                                                                                                                                                       SUBJECTIVE STATEMENT: Reports Lt elbow pain which started in Jan 2024.  History of Rt lateral epicondylitis which improved with dry needling.  He was seen here in Jan initially and since that time has had a steroid injection in Feb 2024 and PRP injection 03/24/23.    PERTINENT HISTORY: Rt tennis elbow, anxiety, DM, HTN  PAIN:  Are you having pain? Yes: NPRS scale: 0 currently,  up to 7-8/10 Pain location: Lt elbow Pain description: sharp Aggravating factors: lifting/pulling that involves wrist extension Relieving factors: avoiding provoking positions  PRECAUTIONS: None  WEIGHT BEARING RESTRICTIONS: No  FALLS:  Has patient fallen in last 6 months? 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  PLOF: Independent and Leisure: golfing, walking the dog  PATIENT GOALS: improve pain  NEXT MD VISIT: PRN  OBJECTIVE:   PATIENT SURVEYS :  04/12/23: FOTO 56 (predicted 68)  COGNITION: Overall cognitive status: Within functional limits for tasks assessed  UPPER EXTREMITY ROM: WNL bil  UPPER EXTREMITY MMT:    MMT Right eval Left eval  Grip Strength 89.9# 46#   Wrist flexion 5/5 5/5  Wrist extension 5/5 4/5 with pain  Wrist pronation 5/5 5/5 with pain  Wrist supination 5/5 5/5 with pain  (Blank rows = not tested)   PALPATION:  04/12/23: trigger points noted in Lt wrist extensors   TODAY'S TREATMENT:                                                                                                                                         DATE:  04/12/23 See HEP - updated prior HEP and reviewed with trial reps PRN; also discussed benefits of elevated heart rate to help with inflammation.  PATIENT EDUCATION: Education details: HEP, DN Person educated: Patient Education  method: Explanation, Demonstration, and Handouts Education comprehension: verbalized understanding, returned demonstration, and needs further education  HOME EXERCISE PROGRAM: Access Code: [email or web address] URL: [email or web address] Date: 04/12/2023 Prepared by: Moshe Cipro  Exercises - Standing Wrist Flexion Stretch  - 2 x daily - 7 x weekly - 1 sets - 3 reps - 30 sec hold - Towel Roll Squeeze  - 2 x daily - 7 x weekly - 1 sets - 15 reps - 5 sec hold - Seated Eccentric Wrist Extension  - 2 x daily - 7 x weekly - 2-3 sets - 10 reps - Forearm Pronation and Supination with Hammer  - 2 x daily - 7 x weekly - 2-3 sets - 10 reps - Ulnar Nerve Flossing  - 1 x daily - 7 x weekly - 1 sets - 10 reps - 3-5 sec hold - Standing Ulnar Nerve Glide  - 1 x daily - 7 x weekly - 10 reps - 3-5 sec hold  ASSESSMENT:  CLINICAL IMPRESSION: Patient is a 52 y.o. male who was seen today for physical therapy evaluation and treatment for Lt elbow pain consistent with Lt lateral episondylitis. He demonstrates decreased strength and pain affecting functional activities.  He will benefit from PT to address deficits listed.   OBJECTIVE IMPAIRMENTS: decreased strength, increased fascial restrictions, increased muscle spasms, impaired UE functional use, and pain.   ACTIVITY LIMITATIONS: carrying, lifting, dressing, and hygiene/grooming  PARTICIPATION LIMITATIONS:  cleaning, driving, shopping, community activity, and yard work  PERSONAL FACTORS: Past/current experiences and 3+ comorbidities: Rt tennis elbow, anxiety, DM, HTN  are also affecting patient's functional outcome.   REHAB POTENTIAL: Good  CLINICAL DECISION MAKING: Stable/uncomplicated  EVALUATION COMPLEXITY: Low  GOALS: Goals reviewed with patient? Yes  SHORT TERM GOALS: Target date: 05/10/2023  Independent with initial HEP Goal status: INITIAL   LONG TERM GOALS: Target date: 06/07/2023  Independent with final HEP Goal status:  INITIAL  2.  FOTO score improved to 68  Goal status: INITIAL  3.  Lt grip strength improved to at least 55# for improved strength Goal status: INIITAL  4.  Report pain < 3/10 with lifting activities for improved function Goal status: INITIAL   PLAN: PT FREQUENCY: 1-2x/week  PT DURATION: 6 weeks  PLANNED INTERVENTIONS: Therapeutic exercises, Therapeutic activity, Neuromuscular re-education, Patient/Family education, Self Care, Joint mobilization, Aquatic Therapy, Dry Needling, Electrical stimulation, Cryotherapy, Moist heat, Splintting, Taping, Ultrasound, Ionotophoresis 4mg /ml Dexamethasone, Manual therapy, and Re-evaluation  PLAN FOR NEXT SESSION: review HEP and progress as able, consider DN or ionto now that pt will be 4 weeks post PRP injection.      Clarita Crane, PT, DPT 04/12/23 9:10 AM

## 2023-04-25 ENCOUNTER — Ambulatory Visit (INDEPENDENT_AMBULATORY_CARE_PROVIDER_SITE_OTHER): Payer: 59 | Admitting: Physical Therapy

## 2023-04-25 ENCOUNTER — Encounter: Payer: Self-pay | Admitting: Physical Therapy

## 2023-04-25 DIAGNOSIS — M25522 Pain in left elbow: Secondary | ICD-10-CM | POA: Diagnosis not present

## 2023-04-25 DIAGNOSIS — M25562 Pain in left knee: Secondary | ICD-10-CM | POA: Diagnosis not present

## 2023-04-25 DIAGNOSIS — M25521 Pain in right elbow: Secondary | ICD-10-CM

## 2023-04-25 DIAGNOSIS — M6281 Muscle weakness (generalized): Secondary | ICD-10-CM

## 2023-04-25 DIAGNOSIS — G8929 Other chronic pain: Secondary | ICD-10-CM

## 2023-04-25 NOTE — Therapy (Addendum)
OUTPATIENT PHYSICAL THERAPY TREATMENT / DISCHARGE   Patient Name: Thomas Richard MRN: [id number] DOB:05/04/1971, 52 y.o., male Today's Date: 04/25/2023  END OF SESSION:  PT End of Session - 04/25/23 0846     Visit Number 2    Number of Visits 8    Date for PT Re-Evaluation 06/07/23    Authorization Type [employer] $20 copay    PT Start Time 0846    PT Stop Time 0905    PT Time Calculation (min) 19 min    Activity Tolerance Patient tolerated treatment well    Behavior During Therapy [facility] for tasks assessed/performed               Past Medical History:  Diagnosis Date   Anxiety    Diabetes mellitus without complication (HCC)    ED (erectile dysfunction)    Hyperlipidemia    Hypertension    Hypothyroid    Insomnia    Prostatitis    Smoker    Past Surgical History:  Procedure Laterality Date   WISDOM TOOTH EXTRACTION  1996   Patient Active Problem List   Diagnosis Date Noted   Acute pain of left knee 07/27/2022   Acute pain of left shoulder 07/07/2022   Bruxism, sleep-related 04/27/2022   Snoring 04/27/2022   Prognathia 04/27/2022   Witnessed episode of apnea 04/27/2022   Tobacco dependence with current use 04/27/2022   Colon cancer screening 08/14/2021   Insulin pump in place 08/14/2021   Lateral epicondylitis, right elbow 08/13/2020   Analgesic overuse headache 02/07/2020   Chronic left-sided headaches 02/07/2020   Cigarette smoker 02/07/2020   Insulin pump status 02/07/2020   Chronic pain of left knee 02/20/2019    PCP: Creola Corn, MD  REFERRING PROVIDER: Creola Corn, MD  REFERRING DIAG: [phone] (ICD-10-CM) - Pain in left elbow  THERAPY DIAG:  Pain in left elbow  Muscle weakness (generalized)  Pain in right elbow  Chronic pain of left knee  Rationale for Evaluation and Treatment: Rehabilitation  ONSET DATE: Jan 2024  SUBJECTIVE:                                                                                                                                                                                       SUBJECTIVE STATEMENT: "Not too good."  Feels he can do a little more but it's still really painful.     PERTINENT HISTORY: Rt tennis elbow, anxiety, DM, HTN  PAIN:  Are you having pain? Yes: NPRS scale: 0 currently, up to 7-8/10 Pain location: Lt elbow Pain description: sharp Aggravating factors: lifting/pulling that involves wrist extension Relieving factors: avoiding provoking positions  PRECAUTIONS: None  WEIGHT  BEARING RESTRICTIONS: No  FALLS:  Has patient fallen in last 6 months? 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  PLOF: Independent and Leisure: golfing, walking the dog  PATIENT GOALS: improve pain  NEXT MD VISIT: PRN  OBJECTIVE:   PATIENT SURVEYS :  04/12/23: FOTO 56 (predicted 68)  COGNITION: Overall cognitive status: Within functional limits for tasks assessed  UPPER EXTREMITY ROM: WNL bil  UPPER EXTREMITY MMT:    MMT Right eval Left eval Left 04/25/23  Grip Strength 89.9# 46#    Wrist flexion 5/5 5/5   Wrist extension 5/5 4/5 with pain   Wrist pronation 5/5 5/5 with pain   Wrist supination 5/5 5/5 with pain 3/5 with pain; 4/5 after DN with decr pain  (Blank rows = not tested)   PALPATION:  04/12/23: trigger points noted in Lt wrist extensors   TODAY'S TREATMENT:                                                                                                                                         DATE:  04/25/23 Manual STM with compression to Lt wrist extensors and supinator; skilled palpation and monitoring of soft tissue during DN Trigger Point Dry-Needling  Treatment instructions: Expect mild to moderate muscle soreness. S/S of pneumothorax if dry needled over a lung field, and to seek immediate medical attention should they occur.  Patient verbalized understanding of these instructions and education.  Patient Consent Given: Yes Education handout provided: Previously provided Muscles treated: Lt supinator Electrical stimulation performed: No Parameters: N/A Treatment response/outcome: twitch response with decreased pain following; improved strength testing immediately following with less pain  TherEx Discussed continuation of stretching exercises but holding on strengthening at this time due to elevated pain; recommended strap with activity to help calm down areas of pain  04/12/23 See HEP - updated prior HEP and reviewed with trial reps PRN; also discussed benefits of elevated heart rate to help with inflammation.  PATIENT EDUCATION: Education details: HEP, DN Person educated: Patient Education method: Explanation, Demonstration, and Handouts Education comprehension: verbalized understanding, returned demonstration, and needs further education  HOME EXERCISE PROGRAM: Access Code: [email or web address] URL: [email or web address] Date: 04/12/2023 Prepared by: Moshe Cipro  Exercises - Standing Wrist Flexion Stretch  - 2 x daily - 7 x weekly - 1 sets - 3 reps - 30 sec hold - Towel Roll Squeeze  - 2 x daily - 7 x weekly - 1 sets - 15 reps - 5 sec hold - Seated Eccentric Wrist Extension  - 2 x daily - 7 x weekly - 2-3 sets - 10 reps - Forearm Pronation and Supination with Hammer  - 2 x daily - 7 x weekly - 2-3 sets - 10 reps - Ulnar Nerve Flossing  - 1 x daily -  7 x weekly - 1 sets - 10 reps - 3-5 sec hold - Standing Ulnar Nerve Glide  - 1 x daily - 7 x weekly - 10 reps - 3-5 sec hold  ASSESSMENT:  CLINICAL IMPRESSION: Pt tolerated session well today with trial of DN to supinator.  Reduction in pain with strength testing with improved strength noted immediately following.  Will continue to benefit from PT to maximize function.  OBJECTIVE IMPAIRMENTS: decreased strength, increased fascial restrictions,  increased muscle spasms, impaired UE functional use, and pain.   ACTIVITY LIMITATIONS: carrying, lifting, dressing, and hygiene/grooming  PARTICIPATION LIMITATIONS: cleaning, driving, shopping, community activity, and yard work  PERSONAL FACTORS: Past/current experiences and 3+ comorbidities: Rt tennis elbow, anxiety, DM, HTN  are also affecting patient's functional outcome.   REHAB POTENTIAL: Good  CLINICAL DECISION MAKING: Stable/uncomplicated  EVALUATION COMPLEXITY: Low  GOALS: Goals reviewed with patient? Yes  SHORT TERM GOALS: Target date: 05/10/2023  Independent with initial HEP Goal status: INITIAL   LONG TERM GOALS: Target date: 06/07/2023  Independent with final HEP Goal status: INITIAL  2.  FOTO score improved to 68  Goal status: INITIAL  3.  Lt grip strength improved to at least 55# for improved strength Goal status: INIITAL  4.  Report pain < 3/10 with lifting activities for improved function Goal status: INITIAL   PLAN: PT FREQUENCY: 1-2x/week  PT DURATION: 6 weeks  PLANNED INTERVENTIONS: Therapeutic exercises, Therapeutic activity, Neuromuscular re-education, Patient/Family education, Self Care, Joint mobilization, Aquatic Therapy, Dry Needling, Electrical stimulation, Cryotherapy, Moist heat, Splintting, Taping, Ultrasound, Ionotophoresis 4mg /ml Dexamethasone, Manual therapy, and Re-evaluation  PLAN FOR NEXT SESSION: review HEP PRN,assess response to DN, consider ionto    Clarita Crane, PT, DPT 04/25/23 9:09 AM   PHYSICAL THERAPY DISCHARGE SUMMARY  Visits from Start of Care: 2  Current functional level related to goals / functional outcomes: See note   Remaining deficits: See note   Education / Equipment: HEP  Patient goals were partially met. Patient is being discharged due to not returning since the last visit.   Chyrel Masson, PT, DPT, OCS, ATC 06/02/23  10:02 AM

## 2023-05-17 ENCOUNTER — Encounter: Payer: 59 | Admitting: Physical Therapy

## 2023-06-02 ENCOUNTER — Ambulatory Visit (INDEPENDENT_AMBULATORY_CARE_PROVIDER_SITE_OTHER): Payer: 59 | Admitting: Sports Medicine

## 2023-06-02 ENCOUNTER — Encounter: Payer: Self-pay | Admitting: Sports Medicine

## 2023-06-02 ENCOUNTER — Telehealth: Payer: Self-pay | Admitting: Sports Medicine

## 2023-06-02 DIAGNOSIS — S56512A Strain of other extensor muscle, fascia and tendon at forearm level, left arm, initial encounter: Secondary | ICD-10-CM

## 2023-06-02 DIAGNOSIS — M25522 Pain in left elbow: Secondary | ICD-10-CM

## 2023-06-02 DIAGNOSIS — G8929 Other chronic pain: Secondary | ICD-10-CM

## 2023-06-02 DIAGNOSIS — M7712 Lateral epicondylitis, left elbow: Secondary | ICD-10-CM

## 2023-06-02 NOTE — Progress Notes (Signed)
Patient states that he is not feeling much better. He believes that pain is a bit better but tightness has gotten worse. He has been going to physical therapy but cancelled most recent appointment feeling that it would not be much help.

## 2023-06-02 NOTE — Progress Notes (Signed)
Thomas Richard - 52 y.o. male MRN [id number]  Date of birth: 1971-08-13  Office Visit Note: Visit Date: 06/02/2023 PCP: Thomas Corn, MD Referred by: Thomas Corn, MD  Subjective: Chief Complaint  Patient presents with   Left Elbow - Follow-up, Pain   HPI: Thomas Richard is a pleasant 52 y.o. male who presents today for follow-up left elbow lateral epicondylitis.  Had an ultrasound evaluation by myself on 11/30/2022 which showed epicondylitis with high-grade common extensor tendon tendinosis with likely some partial tearing.  There were no retracted tears or full-thickness tears on that ultrasound evaluation.  He did have a previous corticosteroid injection which gave him good relief but never fully resolved his pain. He did undergo PRP injection on 03/24/2023.  Currently, his elbow is certainly doing better than it was prior to our invention.  He was able to trim his strokes the other day and does not have any worsening pain, which was the first time in a while.  He still has some pain and feels some mild restriction with certain motions.  He has done some of his stretching home exercises and has seen Thomas Richard, PT once or twice since the injection.  This pain and function is better but still not 100% and this has been bothering him since January of this year.  Pertinent ROS were reviewed with the patient and found to be negative unless otherwise specified above in HPI.   Assessment & Plan: Visit Diagnoses:  1. Lateral epicondylitis, left elbow   2. Chronic elbow pain, left   3. Partial tear of common extensor tendon of left elbow    Plan: Thomas Richard is dealing with his residual left elbow lateral epicondylitis with likely partial tearing of the common extensor origin.  Previous ultrasound examination showed high-grade, extensor tendon tendinosis with some partial tearing but no full-thickness tearing or tendon retraction.  He has received improvement with prior corticosteroid and then most recently a  PRP injection just over 2 months ago.  He is certainly improved in terms of his pain and function but still not near 100%.  At this point, he has been doing more stretches but no actual rehab.  Given that he is 2 months out from PRP I would like him to start ramping up his physical therapy with a 1 to 2 pound weight.  He may continue some formal physical therapy with dry needling and other soft tissue modalities.  I would like to see over the next 4 to 6 weeks if he is continuing to find improvement -he is improving he will continue therapy, could always consider 1 additional PRP injection.  If he is plateauing or starting to become worse again, we discussed neck step would likely be sending him to my partner Thomas Richard to talk about tendon debridement and surgical intervention.  He will send a message or call me over this time to discuss.  If surgery were to be ascertained, I will message Thomas Richard beforehand to see if he would need an MRI at prior to surgery or if he would be okay with evaluating my ultrasound images.  If surgery, ask Thomas Richard if needs/wants MRI  Follow-up: Return if symptoms worsen or fail to improve, for will call/message me in 4-6 weeks of improvement.   Meds & Orders: No orders of the defined types were placed in this encounter.  No orders of the defined types were placed in this encounter.    Procedures: No procedures performed      Clinical  History: No specialty comments available.  He reports that he has been smoking cigarettes. He has a 10 pack-year smoking history. He has never used smokeless tobacco. No results for input(s): "HGBA1C", "LABURIC" in the last 8760 hours.  Objective:   Vital Signs: There were no vitals taken for this visit.  Physical Exam  Gen: Well-appearing, in no acute distress; non-toxic CV:  Well-perfused. Warm.  Resp: Breathing unlabored on room air; no wheezing. Psych: Fluid speech in conversation; appropriate affect; normal thought  process Neuro: Sensation intact throughout. No gross coordination deficits.   Ortho Exam - Left elbow: + Mild TTP directly over the lateral epicondyle.  There is no significant tenderness within the common extensor origin today.  He has full flexion and extension of the elbow. There is some mild pain with Maudsley's sign.  He has good strength with resisted wrist extension and pronation/supination.  Imaging:  L-elbow (LE) [place] on 11/30/22:   Past Medical/Family/Surgical/Social History: Medications & Allergies reviewed per EMR, new medications updated. Patient Active Problem List   Diagnosis Date Noted   Acute pain of left knee 07/27/2022   Acute pain of left shoulder 07/07/2022   Bruxism, sleep-related 04/27/2022   Snoring 04/27/2022   Prognathia 04/27/2022   Witnessed episode of apnea 04/27/2022   Tobacco dependence with current use 04/27/2022   Colon cancer screening 08/14/2021   Insulin pump in place 08/14/2021   Lateral epicondylitis, right elbow 08/13/2020   Analgesic overuse headache 02/07/2020   Chronic left-sided headaches 02/07/2020   Cigarette smoker 02/07/2020   Insulin pump status 02/07/2020   Chronic pain of left knee 02/20/2019   Past Medical History:  Diagnosis Date   Anxiety    Diabetes mellitus without complication (HCC)    ED (erectile dysfunction)    Hyperlipidemia    Hypertension    Hypothyroid    Insomnia    Prostatitis    Smoker    Family History  Problem Relation Age of Onset   Depression Mother    Lung disease Father    Diabetes Father    Hypertension Father    Colon cancer Neg Hx    Esophageal cancer Neg Hx    Rectal cancer Neg Hx    Inflammatory bowel disease Neg Hx    Liver disease Neg Hx    Pancreatic cancer Neg Hx    Stomach cancer Neg Hx    Past Surgical History:  Procedure Laterality Date   WISDOM TOOTH EXTRACTION  1996   Social History   Occupational History   Not on file  Tobacco Use   Smoking status: Every Day     Current packs/day: 1.00    Average packs/day: 1 pack/day for 10.0 years (10.0 ttl pk-yrs)    Types: Cigarettes   Smokeless tobacco: Never  Vaping Use   Vaping status: Never Used  Substance and Sexual Activity   Alcohol use: Yes    Alcohol/week: 0.0 standard drinks of alcohol    Comment: 2 beers per dasy   Drug use: No   Sexual activity: Not on file   I spent 34 minutes in the care of the patient today including face-to-face time, preparation to see the patient, as well as review of previous imaging, review of PT notes, discussion on HEP and conservative treatment vs. Surgical intervention; discussing general overview of possible surgery to be performed by one of my orthopedic colleagues (tendon debridement via Thomas Richard) for the above diagnoses.   Madelyn Brunner, DO Primary Care Sports Medicine Physician  [facility]  This note was dictated using Dragon naturally speaking software and may contain errors in syntax, spelling, or content which have not been identified prior to signing this note.

## 2023-09-06 NOTE — Patient Instructions (Signed)

## 2023-09-06 NOTE — Progress Notes (Unsigned)
PATIENT: Thomas Richard DOB: 18-Jul-1971  REASON FOR VISIT: follow up HISTORY FROM: patient  No chief complaint on file.    HISTORY OF PRESENT ILLNESS:  09/06/23 ALL:  Thomas Richard returns for follow up for OSA on CPAP.   09/06/2022 ALL:  Thomas Richard is a 52 y.o. male here today for follow up for OSA on CPAP.  He was seen in consult with Dr Vickey Huger 04/2022 for snoring and witnessed apneic events. HST 06/2022 showed severe OSA with AHI 56.1/hr, REM AHI 26.7/h and O2 nadir 69%. AutoPAP ordered. Since, he is doing well. He is adjusting to therapy without difficulty. He has not noted any particular improvement in sleep. He is using a FFM. Nasal pillow did not work for him. He does note a leak at home. AHI is well managed.     HISTORY: (copied from Dr Dohmeier's previous note)  HPI:  Thomas Richard is a 52 y.o. male , has been seen here 11/2015 upon referral from Dr. Timothy Lasso for a sleep consultation, was then , in 2021,  seen for a headache evaluation.  Today, 04-27-2022 again for sleep evaluation: Presents today to evaluate sleep apnea a concern. His GF witnesses apnea episodes as well as snoring. He avg 7/8 hrs of sleep and wakes up 1-2 times to go to the bathroom able to fall back asleep easily. He never proceeded to have  a PSG, which was not covered by his insurance.His GF confirmed he is having apneas, he is grinding his teeth. His dentist Dr. Emily Filbert created created a dental device for him to prevent bruxism marks.  The patient feels this has helped his headaches TMJ but it has not affected snoring or possible apnea. He is here today to address this.  When I saw the patient in the past he was neither excessively fatigued nor daytime sleepy and this is true today again his Epworth Sleepiness Scale is endorsed at 1 out of 24 points his fatigue severity score was 17 out of 63 points. He is still a smoker , 30 cigarettes a day. Also has been vaping to cut down on cig.   He is drinking beer 2-3 a week. He  drinks miller light - low carbs ,is a diabetic.  Mr. Spada dentist has now asked him if he has bruxism, and was concerned about OSA as well.    The current comorbidities of hypertension and diabetes were endorsed in 2017. The patient remains an active tobacco user but has not been diagnosed with any primary pulmonary diseases. He is smoking tobacco he has kept his weight steady but his diabetes was not always well controlled. He continues to work as a Engineer, technical sales and exercises in form of walking his dog daily. 4 times a week.    REVIEW OF SYSTEMS: Out of a complete 14 system review of symptoms, the patient complains only of the following symptoms, none and all other reviewed systems are negative.  ESS: 0/24, previously 1/24  ALLERGIES: No Known Allergies  HOME MEDICATIONS: Outpatient Medications Prior to Visit  Medication Sig Dispense Refill   ALPRAZolam (XANAX) 1 MG tablet Take 1 mg by mouth at bedtime as needed for anxiety.     amLODipine (NORVASC) 10 MG tablet Take 10 mg by mouth daily.     aspirin 81 MG tablet Take 81 mg by mouth daily.     atorvastatin (LIPITOR) 40 MG tablet Take 40 mg by mouth daily.     Continuous Blood Gluc Sensor (FREESTYLE  LIBRE 14 DAY SENSOR) MISC SMARTSIG:1 Each Topical Every 2 Weeks     escitalopram (LEXAPRO) 10 MG tablet Take 10 mg by mouth daily.     insulin lispro (HUMALOG) 100 UNIT/ML injection Inject into the skin See admin instructions. Via Insulin Pump     levothyroxine (SYNTHROID, LEVOTHROID) 75 MCG tablet Take 75-150 mcg by mouth daily before breakfast. 150 mcg on Sat, and 75 mcg all other days     loratadine (CLARITIN) 10 MG tablet Take 10 mg by mouth daily.     Multiple Vitamin (MULTIVITAMIN) tablet Take 1 tablet by mouth daily.     Multiple Vitamins-Minerals (MULTIVITAMIN WITH MINERALS) tablet Take 1 tablet by mouth daily.     olmesartan (BENICAR) 40 MG tablet Take 40 mg by mouth daily.     spironolactone (ALDACTONE) 25 MG  tablet Take 25 mg by mouth daily.     traMADol (ULTRAM) 50 MG tablet Take 1 tablet (50 mg total) by mouth every 6 (six) hours as needed. 16 tablet 0   No facility-administered medications prior to visit.    PAST MEDICAL HISTORY: Past Medical History:  Diagnosis Date   Anxiety    Diabetes mellitus without complication (HCC)    ED (erectile dysfunction)    Hyperlipidemia    Hypertension    Hypothyroid    Insomnia    Prostatitis    Smoker     PAST SURGICAL HISTORY: Past Surgical History:  Procedure Laterality Date   WISDOM TOOTH EXTRACTION  1996    FAMILY HISTORY: Family History  Problem Relation Age of Onset   Depression Mother    Lung disease Father    Diabetes Father    Hypertension Father    Colon cancer Neg Hx    Esophageal cancer Neg Hx    Rectal cancer Neg Hx    Inflammatory bowel disease Neg Hx    Liver disease Neg Hx    Pancreatic cancer Neg Hx    Stomach cancer Neg Hx     SOCIAL HISTORY: Social History   Socioeconomic History   Marital status: Single    Spouse name: Not on file   Number of children: 0   Years of education: Not on file   Highest education level: Not on file  Occupational History   Not on file  Tobacco Use   Smoking status: Every Day    Current packs/day: 1.00    Average packs/day: 1 pack/day for 10.0 years (10.0 ttl pk-yrs)    Types: Cigarettes   Smokeless tobacco: Never  Vaping Use   Vaping status: Never Used  Substance and Sexual Activity   Alcohol use: Yes    Alcohol/week: 0.0 standard drinks of alcohol    Comment: 2 beers per dasy   Drug use: No   Sexual activity: Not on file  Other Topics Concern   Not on file  Social History Narrative   Financial advisor Bevelyn Ngo      Drinks 2-3 cups of coffee in the morning.   Social Determinants of Health   Financial Resource Strain: Not on file  Food Insecurity: Not on file  Transportation Needs: Not on file  Physical Activity: Not on file  Stress: Not on file  Social  Connections: Not on file  Intimate Partner Violence: Not on file     PHYSICAL EXAM  There were no vitals filed for this visit.  There is no height or weight on file to calculate BMI.  Generalized: Well developed, in no acute distress  Cardiology: normal rate and rhythm, no murmur noted Respiratory: clear to auscultation bilaterally  Neurological examination  Mentation: Alert oriented to time, place, history taking. Follows all commands speech and language fluent Cranial nerve II-XII: Pupils were equal round reactive to light. Extraocular movements were full, visual field were full  Motor: The motor testing reveals 5 over 5 strength of all 4 extremities. Good symmetric motor tone is noted throughout.  Gait and station: Gait is normal.    DIAGNOSTIC DATA (LABS, IMAGING, TESTING) - I reviewed patient records, labs, notes, testing and imaging myself where available.      No data to display           Lab Results  Component Value Date   WBC 9.2 06/30/2021   HGB 14.9 06/30/2021   HCT 42.8 06/30/2021   MCV 95.7 06/30/2021   PLT 294 06/30/2021   No results found for: "NA", "K", "CL", "CO2", "GLUCOSE", "BUN", "CREATININE", "CALCIUM", "PROT", "ALBUMIN", "AST", "ALT", "ALKPHOS", "BILITOT", "GFRNONAA", "GFRAA" No results found for: "CHOL", "HDL", "LDLCALC", "LDLDIRECT", "TRIG", "CHOLHDL" No results found for: "HGBA1C" No results found for: "VITAMINB12" No results found for: "TSH"   ASSESSMENT AND PLAN 52 y.o. year old male  has a past medical history of Anxiety, Diabetes mellitus without complication (HCC), ED (erectile dysfunction), Hyperlipidemia, Hypertension, Hypothyroid, Insomnia, Prostatitis, and Smoker. here with   No diagnosis found.     Suryansh Craigen is doing well on CPAP therapy. Compliance report reveals excellent compliance. I have advised he monitor for leak at home. May consider mask refitting if needed. AHI is well managed. Facial hair most likely contributes. He  was encouraged to continue using CPAP nightly and for greater than 4 hours each night. We will update supply orders as indicated. Risks of untreated sleep apnea review and education materials provided. Healthy lifestyle habits encouraged. He will follow up in 1 year, sooner if needed. He verbalizes understanding and agreement with this plan.    No orders of the defined types were placed in this encounter.    No orders of the defined types were placed in this encounter.     Shawnie Dapper, FNP-C 09/06/2023, 12:59 PM [facility] [address] [place], [place] [address] [phone]

## 2023-09-07 ENCOUNTER — Encounter: Payer: Self-pay | Admitting: Family Medicine

## 2023-09-07 ENCOUNTER — Ambulatory Visit: Payer: 59 | Admitting: Family Medicine

## 2023-09-07 VITALS — BP 121/80 | HR 84 | Ht 72.0 in | Wt 230.8 lb

## 2023-09-07 DIAGNOSIS — G4733 Obstructive sleep apnea (adult) (pediatric): Secondary | ICD-10-CM

## 2023-09-26 ENCOUNTER — Telehealth: Payer: Self-pay | Admitting: Family Medicine

## 2023-09-26 NOTE — Telephone Encounter (Signed)
Pt is f/u on requested Rx for needed CPAP supplies, please send Rx to DME

## 2023-09-26 NOTE — Telephone Encounter (Signed)
Community msg sent to dme thru epic

## 2024-02-24 ENCOUNTER — Other Ambulatory Visit: Payer: Self-pay | Admitting: Internal Medicine

## 2024-02-24 DIAGNOSIS — Z0000 Encounter for general adult medical examination without abnormal findings: Secondary | ICD-10-CM

## 2024-03-09 ENCOUNTER — Ambulatory Visit
Admission: RE | Admit: 2024-03-09 | Discharge: 2024-03-09 | Disposition: A | Discharge status: Home / Self Care | Source: Ambulatory Visit | Attending: Internal Medicine | Admitting: Internal Medicine

## 2024-03-09 DIAGNOSIS — Z0000 Encounter for general adult medical examination without abnormal findings: Secondary | ICD-10-CM

## 2024-09-05 NOTE — Patient Instructions (Signed)

## 2024-09-05 NOTE — Progress Notes (Unsigned)
 SABRA

## 2024-09-05 NOTE — Progress Notes (Unsigned)
 PATIENT: Thomas Richard DOB: 1971-06-01  REASON FOR VISIT: follow up HISTORY FROM: patient  No chief complaint on file.    HISTORY OF PRESENT ILLNESS:  09/05/24 ALL:  Omair returns for follow up for OSA on CPAP.     09/07/2023 ALL:  Brenyn returns for follow up for OSA on CPAP. He is doing well on therapy. He is using CPAP nightly for about 7.5 hours on average. He does note a leak in mask. Not overly bothersome. AHI well managed. He uses FFM and has facial hair. Headaches are better.     09/06/2022 ALL:  Thomas Richard is a 53 y.o. male here today for follow up for OSA on CPAP.  He was seen in consult with Dr Chalice 04/2022 for snoring and witnessed apneic events. HST 06/2022 showed severe OSA with AHI 56.1/hr, REM AHI 26.7/h and O2 nadir 69%. AutoPAP ordered. Since, he is doing well. He is adjusting to therapy without difficulty. He has not noted any particular improvement in sleep. He is using a FFM. Nasal pillow did not work for him. He does note a leak at home. AHI is well managed.     HISTORY: (copied from Dr Dohmeier's previous note)  HPI:  Thomas Richard is a 54 y.o. male , has been seen here 11/2015 upon referral from Dr. Onita for a sleep consultation, was then , in 2021,  seen for a headache evaluation.  Today, 04-27-2022 again for sleep evaluation: Presents today to evaluate sleep apnea a concern. His GF witnesses apnea episodes as well as snoring. He avg 7/8 hrs of sleep and wakes up 1-2 times to go to the bathroom able to fall back asleep easily. He never proceeded to have  a PSG, which was not covered by his insurance.His GF confirmed he is having apneas, he is grinding his teeth. His dentist Dr. Robinson created created a dental device for him to prevent bruxism marks.  The patient feels this has helped his headaches TMJ but it has not affected snoring or possible apnea. He is here today to address this.  When I saw the patient in the past he was neither excessively fatigued nor  daytime sleepy and this is true today again his Epworth Sleepiness Scale is endorsed at 1 out of 24 points his fatigue severity score was 17 out of 63 points. He is still a smoker , 30 cigarettes a day. Also has been vaping to cut down on cig.   He is drinking beer 2-3 a week. He drinks miller light - low carbs ,is a diabetic.  Mr. Echeverri dentist has now asked him if he has bruxism, and was concerned about OSA as well.    The current comorbidities of hypertension and diabetes were endorsed in 2017. The patient remains an active tobacco user but has not been diagnosed with any primary pulmonary diseases. He is smoking tobacco he has kept his weight steady but his diabetes was not always well controlled. He continues to work as a Engineer, Technical [employer] and exercises in form of walking his dog daily. 4 times a week.    REVIEW OF SYSTEMS: Out of a complete 14 system review of symptoms, the patient complains only of the following symptoms, none and all other reviewed systems are negative.  ESS: 1/24  ALLERGIES: No Known Allergies  HOME MEDICATIONS: Outpatient Medications Prior to Visit  Medication Sig Dispense Refill   ALPRAZolam (XANAX) 1 MG tablet Take 1 mg by mouth at bedtime as  needed for anxiety.     amLODipine (NORVASC) 10 MG tablet Take 10 mg by mouth daily.     aspirin 81 MG tablet Take 81 mg by mouth daily.     atorvastatin (LIPITOR) 40 MG tablet Take 40 mg by mouth daily.     Continuous Blood Gluc Sensor (FREESTYLE LIBRE 14 DAY SENSOR) MISC SMARTSIG:1 Each Topical Every 2 Weeks (Patient not taking: Reported on 09/07/2023)     escitalopram (LEXAPRO) 10 MG tablet Take 10 mg by mouth daily.     insulin  lispro (HUMALOG) 100 UNIT/ML injection Inject into the skin See admin instructions. Via Insulin  Pump     levothyroxine (SYNTHROID, LEVOTHROID) 75 MCG tablet Take 75-150 mcg by mouth daily before breakfast. 150 mcg on Sat, and 75 mcg all other days     loratadine (CLARITIN) 10 MG  tablet Take 10 mg by mouth daily.     Multiple Vitamin (MULTIVITAMIN) tablet Take 1 tablet by mouth daily.     Multiple Vitamins-Minerals (MULTIVITAMIN WITH MINERALS) tablet Take 1 tablet by mouth daily.     olmesartan (BENICAR) 40 MG tablet Take 40 mg by mouth daily.     spironolactone (ALDACTONE) 25 MG tablet Take 25 mg by mouth daily.     traMADol  (ULTRAM ) 50 MG tablet Take 1 tablet (50 mg total) by mouth every 6 (six) hours as needed. (Patient not taking: Reported on 09/07/2023) 16 tablet 0   No facility-administered medications prior to visit.    PAST MEDICAL HISTORY: Past Medical History:  Diagnosis Date   Anxiety    Diabetes mellitus without complication (HCC)    ED (erectile dysfunction)    Hyperlipidemia    Hypertension    Hypothyroid    Insomnia    Prostatitis    Smoker     PAST SURGICAL HISTORY: Past Surgical History:  Procedure Laterality Date   WISDOM TOOTH EXTRACTION  1996    FAMILY HISTORY: Family History  Problem Relation Age of Onset   Depression Mother    Lung disease Father    Diabetes Father    Hypertension Father    Colon cancer Neg Hx    Esophageal cancer Neg Hx    Rectal cancer Neg Hx    Inflammatory bowel disease Neg Hx    Liver disease Neg Hx    Pancreatic cancer Neg Hx    Stomach cancer Neg Hx     SOCIAL HISTORY: Social History   Socioeconomic History   Marital status: Single    Spouse name: Not on file   Number of children: 0   Years of education: Not on file   Highest education level: Not on file  Occupational History   Not on file  Tobacco Use   Smoking status: Every Day    Current packs/day: 1.00    Average packs/day: 1 pack/day for 10.0 years (10.0 ttl pk-yrs)    Types: Cigarettes   Smokeless tobacco: Never  Vaping Use   Vaping status: Never Used  Substance and Sexual Activity   Alcohol  use: Yes    Alcohol /week: 0.0 standard drinks of alcohol     Comment: 2 beers per dasy   Drug use: No   Sexual activity: Not on file   Other Topics Concern   Not on file  Social History Narrative   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   Social Drivers of Corporate Investment Banker Strain: Not on file  Food Insecurity:  Not on file  Transportation Needs: Not on file  Physical Activity: Not on file  Stress: Not on file  Social Connections: Not on file  Intimate Partner Violence: Not on file     PHYSICAL EXAM  There were no vitals filed for this visit.   There is no height or weight on file to calculate BMI.  Generalized: Well developed, in no acute distress  Cardiology: normal rate and rhythm, no murmur noted Respiratory: clear to auscultation bilaterally  Neurological examination  Mentation: Alert oriented to time, place, history taking. Follows all commands speech and language fluent Cranial nerve II-XII: Pupils were equal round reactive to light. Extraocular movements were full, visual field were full  Motor: The motor testing reveals 5 over 5 strength of all 4 extremities. Good symmetric motor tone is noted throughout.  Gait and station: Gait is normal.    DIAGNOSTIC DATA (LABS, IMAGING, TESTING) - I reviewed patient records, labs, notes, testing and imaging myself where available.      No data to display           Lab Results  Component Value Date   WBC 9.2 06/30/2021   HGB 14.9 06/30/2021   HCT 42.8 06/30/2021   MCV 95.7 06/30/2021   PLT 294 06/30/2021   No results found for: NA, K, CL, CO2, GLUCOSE, BUN, CREATININE, CALCIUM, PROT, ALBUMIN, AST, ALT, ALKPHOS, BILITOT, GFRNONAA, GFRAA No results found for: CHOL, HDL, LDLCALC, LDLDIRECT, TRIG, CHOLHDL No results found for: YHAJ8R No results found for: VITAMINB12 No results found for: TSH   ASSESSMENT AND PLAN 53 y.o. year old male  has a past medical history of Anxiety, Diabetes mellitus without complication (HCC), ED (erectile dysfunction),  Hyperlipidemia, Hypertension, Hypothyroid, Insomnia, Prostatitis, and Smoker. here with   No diagnosis found.    Edmund Holcomb is doing well on CPAP therapy. Compliance report reveals excellent compliance. I have advised he monitor for leak at home. May consider mask refitting if needed. AHI is well managed. Facial hair most likely contributes. He was encouraged to continue using CPAP nightly and for greater than 4 hours each night. We will update supply orders as indicated. Risks of untreated sleep apnea review and education materials provided. Healthy lifestyle habits encouraged. He will follow up in 1 year, sooner if needed. He verbalizes understanding and agreement with this plan.    No orders of the defined types were placed in this encounter.    No orders of the defined types were placed in this encounter.     Greig Forbes, FNP-C 09/05/2024, 8:59 AM [facility] [address] [place], [place] [address] [phone]

## 2024-09-06 ENCOUNTER — Encounter: Payer: Self-pay | Admitting: Family Medicine

## 2024-09-06 ENCOUNTER — Ambulatory Visit: Payer: 59 | Admitting: Family Medicine

## 2024-09-06 VITALS — BP 125/80 | Ht 72.0 in | Wt 242.0 lb

## 2024-09-06 DIAGNOSIS — G4733 Obstructive sleep apnea (adult) (pediatric): Secondary | ICD-10-CM

## 2025-10-07 ENCOUNTER — Ambulatory Visit: Admitting: Family Medicine
